# Patient Record
Sex: Female | Born: 1995 | Race: Black or African American | Hispanic: No | Marital: Single | Smoking: Never smoker
Health system: Southern US, Community
[De-identification: ages and names within clinical notes are randomized; demographics above are authoritative.]

## PROBLEM LIST (undated history)

## (undated) HISTORY — PX: KNEE SURGERY: SHX244

---
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
## 2013-04-07 ENCOUNTER — Emergency Department (HOSPITAL_COMMUNITY)
Admission: EM | Admit: 2013-04-07 | Discharge: 2013-04-07 | Disposition: A | Discharge status: Home / Self Care | Payer: No Typology Code available for payment source | Attending: Emergency Medicine | Admitting: Emergency Medicine

## 2013-04-07 ENCOUNTER — Emergency Department (HOSPITAL_COMMUNITY): Payer: No Typology Code available for payment source

## 2013-04-07 ENCOUNTER — Encounter (HOSPITAL_COMMUNITY): Payer: Self-pay | Admitting: Emergency Medicine

## 2013-04-07 ENCOUNTER — Other Ambulatory Visit: Payer: Self-pay

## 2013-04-07 DIAGNOSIS — Z3202 Encounter for pregnancy test, result negative: Secondary | ICD-10-CM | POA: Insufficient documentation

## 2013-04-07 DIAGNOSIS — R0789 Other chest pain: Secondary | ICD-10-CM

## 2013-04-07 DIAGNOSIS — Z8249 Family history of ischemic heart disease and other diseases of the circulatory system: Secondary | ICD-10-CM | POA: Insufficient documentation

## 2013-04-07 DIAGNOSIS — R071 Chest pain on breathing: Secondary | ICD-10-CM | POA: Insufficient documentation

## 2013-04-07 LAB — URINALYSIS, ROUTINE W REFLEX MICROSCOPIC
Bilirubin Urine: NEGATIVE
Glucose, UA: NEGATIVE mg/dL
Leukocytes, UA: NEGATIVE
Nitrite: NEGATIVE
Specific Gravity, Urine: 1.022 (ref 1.005–1.030)
Urobilinogen, UA: 0.2 mg/dL (ref 0.0–1.0)
pH: 6.5 (ref 5.0–8.0)

## 2013-04-07 LAB — BASIC METABOLIC PANEL
BUN: 12 mg/dL (ref 6–23)
Chloride: 104 mEq/L (ref 96–112)
GFR calc Af Amer: >90 mL/min (ref >90)
GFR calc non Af Amer: >90 mL/min (ref >90)
Potassium: 4.1 mEq/L (ref 3.5–5.1)
Sodium: 139 mEq/L (ref 135–145)

## 2013-04-07 LAB — POCT I-STAT TROPONIN I: Troponin i, poc: 0 ng/mL (ref 0.00–0.08)

## 2013-04-07 LAB — CBC
HCT: 39.8 % (ref 36.0–46.0)
MCH: 28.8 pg (ref 26.0–34.0)
Platelets: 266 10*3/uL (ref 150–400)
RDW: 13.7 % (ref 11.5–15.5)
WBC: 7.8 10*3/uL (ref 4.0–10.5)

## 2013-04-07 MED ORDER — IBUPROFEN 800 MG PO TABS: 800.0000 mg | ORAL_TABLET | Freq: Three times a day (TID) | ORAL | Status: DC

## 2013-04-07 NOTE — ED Provider Notes (Signed)
CSN: [id number]     Arrival date & time 04/07/13  4098 History   First MD Initiated Contact with Patient 04/07/13 0557     Chief Complaint  Patient presents with  . Chest Pain   (Consider location/radiation/quality/duration/timing/severity/associated sxs/prior Treatment) The history is provided by the patient.   Patient woke up this morning and noticed the cramping throbbing pain in her left lower chest. The pain is exacerbated by movement and walking and deep inspiration. The pain is constant. Denies fevers, cough, shortness of breath, leg swelling. Denies recent immobilization. No personal or family history of blood clots. She is on depo provera (progestin only). She is concerned because her mother did have a myocardial infarction at the age of 37 and her grandmother died of a myocardial infarction. Patient has also had intermittent pain in her left breast that comes and goes but has not her felt a mass or lump. Denies trauma.    History reviewed. No pertinent past medical history. Past Surgical History  Procedure Laterality Date  . Knee surgery     No family history on file. History  Substance Use Topics  . Smoking status: Never Smoker   . Smokeless tobacco: Not on file  . Alcohol Use: Not on file   OB History   Grav Para Term Preterm Abortions TAB SAB Ect Mult Living                 Review of Systems  Constitutional: Negative for fever, chills, activity change and appetite change.  Respiratory: Negative for cough, choking, chest tightness, shortness of breath, wheezing and stridor.   Cardiovascular: Positive for chest pain.  Gastrointestinal: Negative for nausea, vomiting, abdominal pain and diarrhea.  Genitourinary: Negative for dysuria, urgency, frequency, vaginal bleeding, vaginal discharge and menstrual problem.  Skin: Negative for rash and wound.  Allergic/Immunologic: Negative for immunocompromised state.    Allergies  Review of patient's allergies indicates not on  file.  Home Medications   Current Outpatient Rx  Name  Route  Sig  Dispense  Refill  . medroxyPROGESTERone (DEPO-PROVERA) 150 MG/ML injection   Intramuscular   Inject 150 mg into the muscle every 3 (three) months.          BP 138/95  Pulse 90  Temp(Src) 99.2 F (37.3 C) (Oral)  Resp 16  Ht 5\' 5"  (1.651 m)  Wt 145 lb (65.772 kg)  BMI 24.13 kg/m2  SpO2 100% Physical Exam  Nursing note and vitals reviewed. Constitutional: She appears well-developed and well-nourished. No distress.  HENT:  Head: Normocephalic and atraumatic.  Neck: Neck supple.  Cardiovascular: Normal rate, regular rhythm and intact distal pulses.   Pulmonary/Chest: Effort normal and breath sounds normal. No respiratory distress. She has no wheezes. She has no rales. She exhibits no tenderness.  No masses.    Abdominal: Soft. She exhibits no distension. There is no tenderness. There is no rebound and no guarding.  Musculoskeletal: She exhibits no edema.  Neurological: She is alert.  Skin: She is not diaphoretic.    ED Course  Procedures (including critical care time) Labs Review Labs Reviewed  CBC  BASIC METABOLIC PANEL  URINALYSIS, ROUTINE W REFLEX MICROSCOPIC  POCT PREGNANCY, URINE  POCT I-STAT TROPONIN I   Imaging Review Dg Chest 2 View  04/07/2013   CLINICAL DATA:  Left-sided chest pain.  EXAM: CHEST  2 VIEW  COMPARISON:  None.  FINDINGS: The lungs are well-aerated and clear. There is no evidence of focal opacification, pleural effusion or pneumothorax.  The heart is normal in size; the mediastinal contour is within normal limits. No acute osseous abnormalities are seen.  IMPRESSION: No acute cardiopulmonary process seen.   Electronically Signed   By: Roanna Raider M.D.   On: 04/07/2013 06:57    EKG Interpretation   None      PERC negative    Date: 04/07/2013  Rate: 81  Rhythm: normal sinus rhythm with PVC  QRS Axis: normal  Intervals: normal  ST/T Wave abnormalities: normal   Conduction Disutrbances: none  Narrative Interpretation:   Old EKG Reviewed: unavailable    MDM   1. Chest wall pain    Pt with left lower chest wall pain that she noticed this morning after waking up.  She is not SOB, has no cough, O2 is 100% and has no risk factors for PE.  I do not think this is a PE.  She is PERC negative. She has no respiratory symptoms, doubt pneumonia. CXR is clear.  EKG is normal. Labs ordered in triage are all normal.  D/C home with NSAIDs, PCP follow up.  Discussed results, findings, treatment, and follow up  with patient.  Pt given return precautions.  Pt verbalizes understanding and agrees with plan.       [place], PA-C 04/07/13 [phone]

## 2013-04-07 NOTE — ED Notes (Signed)
She was not triaged before brought back.  She was brought back and I was informed she needed to be triaged.

## 2013-04-07 NOTE — ED Notes (Signed)
Pt presents to the ED with complaints of "pinching" left sided CP and breast pain that woke her up from her sleep around 0430. Denies cough/SOB/N/V/D, lungs sound clear bilaterally. Pt also reports family history of early diagnosed breast cancer and is concerned about this and wanting help with self breast exams.

## 2013-04-16 NOTE — ED Provider Notes (Signed)
Medical screening examination/treatment/procedure(s) were performed by non-physician practitioner and as supervising physician I was immediately available for consultation/collaboration.   Kryslyn Helbig, MD 04/16/13 0502 

## 2013-07-20 ENCOUNTER — Emergency Department (HOSPITAL_COMMUNITY)
Admission: EM | Admit: 2013-07-20 | Discharge: 2013-07-20 | Disposition: A | Discharge status: Home / Self Care | Payer: BC Managed Care – PPO | Source: Home / Self Care | Attending: Family Medicine | Admitting: Family Medicine

## 2013-07-20 ENCOUNTER — Other Ambulatory Visit (HOSPITAL_COMMUNITY)
Admission: RE | Admit: 2013-07-20 | Discharge: 2013-07-20 | Disposition: A | Discharge status: Home / Self Care | Payer: BC Managed Care – PPO | Source: Ambulatory Visit | Attending: Family Medicine | Admitting: Family Medicine

## 2013-07-20 ENCOUNTER — Encounter (HOSPITAL_COMMUNITY): Payer: Self-pay | Admitting: Emergency Medicine

## 2013-07-20 DIAGNOSIS — N760 Acute vaginitis: Secondary | ICD-10-CM

## 2013-07-20 LAB — POCT URINALYSIS DIP (DEVICE)
BILIRUBIN URINE: NEGATIVE
Glucose, UA: NEGATIVE mg/dL
HGB URINE DIPSTICK: NEGATIVE
KETONES UR: NEGATIVE mg/dL
Leukocytes, UA: NEGATIVE
NITRITE: NEGATIVE
Protein, ur: NEGATIVE mg/dL
SPECIFIC GRAVITY, URINE: 1.025 (ref 1.005–1.030)
Urobilinogen, UA: 0.2 mg/dL (ref 0.0–1.0)
pH: 6.5 (ref 5.0–8.0)

## 2013-07-20 LAB — POCT PREGNANCY, URINE: Preg Test, Ur: NEGATIVE

## 2013-07-20 MED ORDER — METRONIDAZOLE 500 MG PO TABS: 500.0000 mg | ORAL_TABLET | Freq: Two times a day (BID) | ORAL | Status: DC

## 2013-07-20 NOTE — ED Notes (Signed)
Call back number for lab issues verified 

## 2013-07-20 NOTE — Discharge Instructions (Signed)
Bacterial Vaginosis Bacterial vaginosis is an infection of the vagina. It happens when too many of certain germs (bacteria) grow in the vagina. HOME CARE  Take your medicine as told by your doctor.  Finish your medicine even if you start to feel better.  Do not have sex until you finish your medicine and are better.  Tell your sex partner that you have an infection. They should see their doctor for treatment.  Practice safe sex. Use condoms. Have only one sex partner. GET HELP IF:  You are not getting better after 3 days of treatment.  You have more grey fluid (discharge) coming from your vagina than before.  You have more pain than before.  You have a fever. MAKE SURE YOU:   Understand these instructions.  Will watch your condition.  Will get help right away if you are not doing well or get worse. Document Released: 01/22/2008 Document Revised: 02/02/2013 Document Reviewed: 11/24/2012 Exit[employer] Patient Information 2014 [employer].  

## 2013-07-20 NOTE — ED Notes (Signed)
Not sure if this is BV or yeast

## 2013-07-20 NOTE — ED Provider Notes (Signed)
Medical screening examination/treatment/procedure(s) were performed by a resident physician or non-physician practitioner and as the supervising physician I was immediately available for consultation/collaboration.  Trinna Kunst, MD    Abem Shaddix S Yoel Kaufhold, MD 07/20/13 2151 

## 2013-07-20 NOTE — ED Provider Notes (Signed)
CSN: [id number]     Arrival date & time 07/20/13  1614 History   First MD Initiated Contact with Patient 07/20/13 1730     Chief Complaint  Patient presents with  . Vaginal Discharge   (Consider location/radiation/quality/duration/timing/severity/associated sxs/prior Treatment) HPI Patient is a 18 yo F presenting to urgent care for vaginal discharge.  Patient was treated three times last month for yeast infections. (Fluconazole x2 and cream x1). She states the discharge went away but only for a day or so. She states she has had discharge for the last 2-3 weeks. Discharge is white/clear and is malodorous. She endorses some itching, but was diagnosed with herpes a few weeks ago. She wears a new panty liner every day. She was treated for GC/Ch and trich at the health dept last month (all negative) and denies any new partners. She is not currently on any birth control and has not had a period since she stopped depo on Feb 28. Denies abd pain, fevers, some post-coital bleeding.   History reviewed. No pertinent past medical history. Past Surgical History  Procedure Laterality Date  . Knee surgery     History reviewed. No pertinent family history. History  Substance Use Topics  . Smoking status: Never Smoker   . Smokeless tobacco: Not on file  . Alcohol Use: Not on file   OB History   Grav Para Term Preterm Abortions TAB SAB Ect Mult Living                 Review of Systems  Constitutional: Negative for fever and chills.  HENT: Negative for congestion.   Eyes: Negative for visual disturbance.  Respiratory: Negative for cough and shortness of breath.   Cardiovascular: Negative for chest pain and leg swelling.  Gastrointestinal: Negative for abdominal pain.  Genitourinary: Positive for vaginal bleeding and vaginal discharge. Negative for dysuria, vaginal pain and pelvic pain.  Musculoskeletal: Negative for arthralgias and myalgias.  Skin: Negative for rash.  Neurological: Negative for  headaches.    Allergies  Review of patient's allergies indicates no known allergies.  Home Medications   Current Outpatient Rx  Name  Route  Sig  Dispense  Refill  . ibuprofen (ADVIL,MOTRIN) 800 MG tablet   Oral   Take 1 tablet (800 mg total) by mouth 3 (three) times daily.   15 tablet   0   . medroxyPROGESTERone (DEPO-PROVERA) 150 MG/ML injection   Intramuscular   Inject 150 mg into the muscle every 3 (three) months.         . metroNIDAZOLE (FLAGYL) 500 MG tablet   Oral   Take 1 tablet (500 mg total) by mouth 2 (two) times daily.   14 tablet   0    BP 134/92  Pulse 77  Temp(Src) 98.4 F (36.9 C) (Oral)  Resp 16  SpO2 100% Physical Exam  Constitutional: She is oriented to person, place, and time. She appears well-developed and well-nourished. No distress.  HENT:  Head: Normocephalic and atraumatic.  Neck: Normal range of motion.  Cardiovascular: Normal rate, regular rhythm and normal heart sounds.   Pulmonary/Chest: Effort normal and breath sounds normal. She has no wheezes.  Abdominal: Soft. She exhibits no distension. There is no tenderness.  Genitourinary: Uterus normal. Vaginal discharge (Moderate amonth of thin, white discharge. No odor.) found.  Musculoskeletal: Normal range of motion. She exhibits no edema and no tenderness.  Neurological: She is alert and oriented to person, place, and time.  Skin: Skin is warm and dry.  Psychiatric: She has a normal mood and affect.    ED Course  Procedures (including critical care time) Labs Review Labs Reviewed  POCT URINALYSIS DIP (DEVICE)  POCT PREGNANCY, URINE  CERVICOVAGINAL ANCILLARY ONLY   Imaging Review No results found.  MDM   1. Vaginitis    - Urine preg negative - Recently tested for STDs and declined retesting at this time - Affirm sent - Recently treated x3 for yeast without resolution of symptoms. Will treat with Flagyl for BV. Advised to avoid any irritating lotions, soaps, pads, body  washes. - F/u with PCP if fails to improve.    Hilarie FredricksonAmber M Jessalynn Mccowan, MD 07/20/13 [phone]

## 2013-07-20 NOTE — ED Notes (Signed)
affirm test only, no gc/chlamydia

## 2013-07-21 LAB — CERVICOVAGINAL ANCILLARY ONLY
WET PREP (BD AFFIRM): NEGATIVE
WET PREP (BD AFFIRM): POSITIVE — AB
Wet Prep (BD Affirm): NEGATIVE

## 2013-07-23 NOTE — ED Notes (Signed)
Affirm: Candida and Trich neg., Gardnerella pos. Pt. adequately treated with Flagyl. Vassie MoselleYork, Bobby Ragan M 07/23/2013

## 2017-04-28 NOTE — L&D Delivery Note (Signed)
Delivery Note Pushed for 2 hours. At 10:57 AM a viable and healthy female was delivered via Vaginal, Spontaneous (Presentation: LOA).  APGAR: 9, 9; weight 8 lb 6.9 oz (3825 g).   Placenta status: Spontaneous, intact.  Cord:3VC  with the following complications: None. Trailing membranes removed w/ ring forceps  Cord pH: NA  Anesthesia: Epidural Episiotomy: None Lacerations: 1st degree;Perineal;Labial, hemostatic Suture Repair: NA Est. Blood Loss (mL): 150  Mom to postpartum.  Baby to Couplet care / Skin to Skin. Please schedule this patient for Postpartum visit in: 4 weeks with the following provider: Any provider For C/S patients schedule nurse incision check in weeks 2 weeks: no Low risk pregnancy complicated by: Nothing Delivery mode:  SVD Anticipated Birth Control:  Nexplanon vs Depo PP Procedures needed: None  Schedule Integrated BH visit: no  [place] 12/28/2017, 10:29 PM

## 2017-05-20 LAB — OB RESULTS CONSOLE RUBELLA ANTIBODY, IGM: RUBELLA: IMMUNE

## 2017-07-06 ENCOUNTER — Encounter: Payer: Self-pay | Admitting: Obstetrics and Gynecology

## 2017-07-06 ENCOUNTER — Ambulatory Visit (INDEPENDENT_AMBULATORY_CARE_PROVIDER_SITE_OTHER): Payer: Managed Care, Other (non HMO) | Admitting: Obstetrics and Gynecology

## 2017-07-06 VITALS — BP 126/84 | HR 92 | Wt 168.5 lb

## 2017-07-06 DIAGNOSIS — Z23 Encounter for immunization: Secondary | ICD-10-CM

## 2017-07-06 DIAGNOSIS — Z3402 Encounter for supervision of normal first pregnancy, second trimester: Secondary | ICD-10-CM

## 2017-07-06 DIAGNOSIS — Z3481 Encounter for supervision of other normal pregnancy, first trimester: Secondary | ICD-10-CM | POA: Diagnosis not present

## 2017-07-06 DIAGNOSIS — Z8659 Personal history of other mental and behavioral disorders: Secondary | ICD-10-CM

## 2017-07-06 DIAGNOSIS — Z3400 Encounter for supervision of normal first pregnancy, unspecified trimester: Secondary | ICD-10-CM | POA: Insufficient documentation

## 2017-07-06 MED ORDER — VITAFOL ULTRA 29-0.6-0.4-200 MG PO CAPS
1.0000 | ORAL_CAPSULE | Freq: Every day | ORAL | 12 refills | Status: DC
Start: 2017-07-06 — End: 2017-12-30

## 2017-07-06 NOTE — Progress Notes (Signed)
   PRENATAL VISIT NOTE  Subjective:  Nichole Taylor is a 22 y.o. G1P0 at 117w1d being seen today for ongoing prenatal care. Patient transferred care from [facility]. She is currently monitored for the following issues for this low-risk pregnancy and has Supervision of normal first pregnancy, antepartum and History of depression on their problem list.  Patient reports no complaints.   . Vag. Bleeding: None.  Movement: Absent. Denies leaking of fluid.   The following portions of the patient's history were reviewed and updated as appropriate: allergies, current medications, past family history, past medical history, past social history, past surgical history and problem list. Problem list updated.  Objective:   Vitals:   07/06/17 1317  BP: 126/84  Pulse: 92  Weight: 168 lb 8 oz (76.4 kg)    Fetal Status: Fetal Heart Rate (bpm): 160   Movement: Absent     General:  Alert, oriented and cooperative. Patient is in no acute distress.  Skin: Skin is warm and dry. No rash noted.   Cardiovascular: Normal heart rate noted  Respiratory: Normal respiratory effort, no problems with respiration noted  Abdomen: Soft, gravid, appropriate for gestational age.  Pain/Pressure: Absent     Pelvic: Cervical exam deferred        Extremities: Normal range of motion.  Edema: None  Mental Status:  Normal mood and affect. Normal behavior. Normal judgment and thought content.   Assessment and Plan:  Pregnancy: G1P0 at 6417w1d  1. Supervision of normal first pregnancy, antepartum Patient is doing well Agreed to flu vaccine today Anatomy ultrasound ordered Carrier screening and NIPS today - US MFM OB COMP + 14 WK; Future - Flu Vaccine QUAD 36+ mos IM (Fluarix, Quad PF) - Flu Vaccine QUAD 36+ mos IM - Genetic Screening - Cystic Fibrosis Mutation 97 - SMN1 COPY NUMBER ANALYSIS (SMA Carrier Screen)  2. History of depression Patient reports feeling well No medication needed  Preterm labor  symptoms and general obstetric precautions including but not limited to vaginal bleeding, contractions, leaking of fluid and fetal movement were reviewed in detail with the patient. Please refer to After Visit Summary for other counseling recommendations.  Return in about 4 weeks (around 08/03/2017) for ROB.   Catalina AntiguaPeggy Kelyn Koskela, MD

## 2017-07-08 LAB — URINE CULTURE, OB REFLEX

## 2017-07-08 LAB — CULTURE, OB URINE

## 2017-07-14 LAB — SMN1 COPY NUMBER ANALYSIS (SMA CARRIER SCREENING)

## 2017-07-15 LAB — CYSTIC FIBROSIS MUTATION 97: Interpretation: NOT DETECTED

## 2017-07-30 ENCOUNTER — Encounter (HOSPITAL_COMMUNITY): Payer: Self-pay | Admitting: *Deleted

## 2017-07-30 ENCOUNTER — Encounter (HOSPITAL_COMMUNITY): Payer: Self-pay | Admitting: Obstetrics and Gynecology

## 2017-07-31 ENCOUNTER — Ambulatory Visit (HOSPITAL_COMMUNITY): Payer: Managed Care, Other (non HMO)

## 2017-08-03 ENCOUNTER — Encounter: Payer: Self-pay | Admitting: Certified Nurse Midwife

## 2017-08-03 ENCOUNTER — Ambulatory Visit (INDEPENDENT_AMBULATORY_CARE_PROVIDER_SITE_OTHER): Payer: Managed Care, Other (non HMO) | Admitting: Certified Nurse Midwife

## 2017-08-03 VITALS — BP 121/79 | HR 94 | Wt 175.0 lb

## 2017-08-03 DIAGNOSIS — Z3400 Encounter for supervision of normal first pregnancy, unspecified trimester: Secondary | ICD-10-CM

## 2017-08-03 NOTE — Progress Notes (Signed)
   PRENATAL VISIT NOTE  Subjective:  Nichole Taylor is a 22 y.o. G1P0 at 553w1d being seen today for ongoing prenatal care.  She is currently monitored for the following issues for this low-risk pregnancy and has Supervision of normal first pregnancy, antepartum and History of depression on their problem list.  Patient reports no complaints.  Contractions: Not present. Vag. Bleeding: None.  Movement: Absent. Denies leaking of fluid.   The following portions of the patient's history were reviewed and updated as appropriate: allergies, current medications, past family history, past medical history, past social history, past surgical history and problem list. Problem list updated.  Objective:   Vitals:   08/03/17 0915  BP: 121/79  Pulse: 94  Weight: 175 lb (79.4 kg)    Fetal Status: Fetal Heart Rate (bpm): 155; doppler   Movement: Absent     General:  Alert, oriented and cooperative. Patient is in no acute distress.  Skin: Skin is warm and dry. No rash noted.   Cardiovascular: Normal heart rate noted  Respiratory: Normal respiratory effort, no problems with respiration noted  Abdomen: Soft, gravid, appropriate for gestational age.  Pain/Pressure: Absent     Pelvic: Cervical exam deferred        Extremities: Normal range of motion.  Edema: None  Mental Status: Normal mood and affect. Normal behavior. Normal judgment and thought content.   Assessment and Plan:  Pregnancy: G1P0 at 2753w1d  1. Supervision of normal first pregnancy, antepartum     Doing well - AFP, Serum, Open Spina Bifida  Preterm labor symptoms and general obstetric precautions including but not limited to vaginal bleeding, contractions, leaking of fluid and fetal movement were reviewed in detail with the patient. Please refer to After Visit Summary for other counseling recommendations.  Return in about 1 month (around 08/31/2017) for ROB.  Future Appointments  Date Time Provider Department Center  08/04/2017  8:15 AM  [facility] US 4 [facility]  08/31/2017  9:00 AM Leida Luton, Rodell Pernaachelle A, CNM [facility] None    Roe Coombsachelle A Amariah Kierstead, CNM

## 2017-08-04 ENCOUNTER — Other Ambulatory Visit: Payer: Self-pay | Admitting: Obstetrics and Gynecology

## 2017-08-04 ENCOUNTER — Ambulatory Visit (HOSPITAL_COMMUNITY)
Admission: RE | Admit: 2017-08-04 | Discharge: 2017-08-04 | Disposition: A | Discharge status: Home / Self Care | Payer: Managed Care, Other (non HMO) | Source: Ambulatory Visit | Attending: Obstetrics and Gynecology | Admitting: Obstetrics and Gynecology

## 2017-08-04 DIAGNOSIS — Z3400 Encounter for supervision of normal first pregnancy, unspecified trimester: Secondary | ICD-10-CM

## 2017-08-04 DIAGNOSIS — Z3A19 19 weeks gestation of pregnancy: Secondary | ICD-10-CM

## 2017-08-04 DIAGNOSIS — Z363 Encounter for antenatal screening for malformations: Secondary | ICD-10-CM | POA: Diagnosis present

## 2017-08-06 ENCOUNTER — Other Ambulatory Visit: Payer: Self-pay | Admitting: Certified Nurse Midwife

## 2017-08-06 DIAGNOSIS — Z3400 Encounter for supervision of normal first pregnancy, unspecified trimester: Secondary | ICD-10-CM

## 2017-08-06 LAB — AFP, SERUM, OPEN SPINA BIFIDA
AFP MoM: 0.67
AFP Value: 33.3 ng/mL
GEST. AGE ON COLLECTION DATE: 19.1 wk
MATERNAL AGE AT EDD: 23.8 a
OSBR Risk 1 IN: 10000
TEST RESULTS AFP: NEGATIVE
Weight: 168 [lb_av]

## 2017-08-31 ENCOUNTER — Other Ambulatory Visit (HOSPITAL_COMMUNITY)
Admission: RE | Admit: 2017-08-31 | Discharge: 2017-08-31 | Disposition: A | Discharge status: Home / Self Care | Payer: Managed Care, Other (non HMO) | Source: Ambulatory Visit | Attending: Certified Nurse Midwife | Admitting: Certified Nurse Midwife

## 2017-08-31 ENCOUNTER — Ambulatory Visit (INDEPENDENT_AMBULATORY_CARE_PROVIDER_SITE_OTHER): Payer: Managed Care, Other (non HMO) | Admitting: Certified Nurse Midwife

## 2017-08-31 ENCOUNTER — Encounter: Payer: Self-pay | Admitting: Certified Nurse Midwife

## 2017-08-31 VITALS — BP 129/83 | HR 93 | Wt 180.4 lb

## 2017-08-31 DIAGNOSIS — Z3400 Encounter for supervision of normal first pregnancy, unspecified trimester: Secondary | ICD-10-CM

## 2017-08-31 DIAGNOSIS — N898 Other specified noninflammatory disorders of vagina: Secondary | ICD-10-CM | POA: Insufficient documentation

## 2017-08-31 NOTE — Progress Notes (Signed)
   PRENATAL VISIT NOTE  Subjective:  Nichole Taylor is a 22 y.o. G1P0 at [redacted]w[redacted]d being seen today for ongoing prenatal care.  She is currently monitored for the following issues for this low-risk pregnancy and has Supervision of normal first pregnancy, antepartum and History of depression on their problem list.  Patient reports no bleeding, no contractions, no cramping, no leaking and vaginal irritation.  Contractions: Not present. Vag. Bleeding: None.  Movement: Present. Denies leaking of fluid.   The following portions of the patient's history were reviewed and updated as appropriate: allergies, current medications, past family history, past medical history, past social history, past surgical history and problem list. Problem list updated.  Objective:   Vitals:   08/31/17 0907  BP: 129/83  Pulse: 93  Weight: 180 lb 6.4 oz (81.8 kg)    Fetal Status:     Movement: Present     General:  Alert, oriented and cooperative. Patient is in no acute distress.  Skin: Skin is warm and dry. No rash noted.   Cardiovascular: Normal heart rate noted  Respiratory: Normal respiratory effort, no problems with respiration noted  Abdomen: Soft, gravid, appropriate for gestational age.  Pain/Pressure: Absent     Pelvic: Cervical exam deferred        Extremities: Normal range of motion.  Edema: None  Mental Status: Normal mood and affect. Normal behavior. Normal judgment and thought content.   Assessment and Plan:  Pregnancy: G1P0 at [redacted]w[redacted]d  1. Supervision of normal first pregnancy, antepartum     Doing well.   - [place] MFM OB FOLLOW UP; Future  2. Vaginal discharge       - Cervicovaginal ancillary only  Preterm labor symptoms and general obstetric precautions including but not limited to vaginal bleeding, contractions, leaking of fluid and fetal movement were reviewed in detail with the patient. Please refer to After Visit Summary for other counseling recommendations.  Return in about 1 month (around  09/28/2017) for ROB, 2 hr OGTT.  No future appointments.  Roe Coombs, CNM

## 2017-08-31 NOTE — Patient Instructions (Signed)
AREA PEDIATRIC/FAMILY PRACTICE PHYSICIANS  [facility] [address], [address] [place], [place]  [address] Phone - [phone]   Fax - [phone]  [facility] [address] [place], [place] [address] Phone - [phone]   Fax - [phone]  [facility] [address] [place], [place]  [address] Phone - [phone]   Fax - [phone]  [facility] [address] [place], [place]  [address] Phone - [phone]   Fax - [phone]  [facility] [address] [place], [place]  [address] Phone - [phone]   Fax - [phone]  [facility] [address], [address] [place], [place]  [address] Phone - [phone]   Fax - [phone]  [facility] [address] [place], [place]  [address] Phone - [phone]   Fax - [phone]  [facility] [address], [address] [place], [place]  [address] Phone - [phone]   Fax - [phone]  [facility] [address] [place], [place]  [address] Phone - [phone]   Fax - [phone] [facility] [address] [place], [place]  [address] Phone - [phone]   Fax - [phone]  [facility] [address] [place], [place]  [address] Phone - [phone]   Fax - [phone]  [facility] [address] [place], [place]  [address] Phone - [phone]   Fax - [phone]  [facility] [address], [address] [place], [place]  [address] Phone - [phone]   Fax - [phone]  [facility] [address] [place], [place]  [address] Phone - [phone]  [facility] [address] [place], [place]  [address] Phone - [phone]   Fax - [phone]  [facility] [address] [place], [place]  [address] Phone - [phone]   Fax - [phone]  [facility] [address] [place], [place]  [address] Phone - [phone]   Fax - [phone]  [facility] [address] [place], [place]  [address] Phone - [phone]   Fax - [phone]   [facility] [address] [place], [place]  [address] Phone - [phone]   Fax - [phone]  [facility] [address] [place], [place]  [address] Phone - [phone]   Fax - [phone]  [facility] [address] [place], [place]  [address] Phone - [phone]   Fax - [phone]  [facility] [address] [place], [place]  [address] Phone - [phone]   Fax - [phone]  [facility] [address] , [place]  [address] Phone - [phone]   Fax - [phone] [facility] [address] [place], [place]  [address] Phone - [phone]   Fax - [phone]  [facility] [address] [place], [place]  [address] Phone - [phone]   Fax - [phone]  [facility] [address] [place], [place]  [address] Phone - [phone]   Fax - [phone]  [facility] Charlene Flemming MD [address] [place] [address] Phone [phone]  Fax [phone]   

## 2017-09-01 LAB — CERVICOVAGINAL ANCILLARY ONLY
Bacterial vaginitis: NEGATIVE
Candida vaginitis: NEGATIVE
Chlamydia: NEGATIVE
NEISSERIA GONORRHEA: NEGATIVE
TRICH (WINDOWPATH): NEGATIVE

## 2017-09-14 ENCOUNTER — Ambulatory Visit (HOSPITAL_COMMUNITY)
Admission: RE | Admit: 2017-09-14 | Discharge: 2017-09-14 | Disposition: A | Discharge status: Home / Self Care | Payer: Managed Care, Other (non HMO) | Source: Ambulatory Visit | Attending: Certified Nurse Midwife | Admitting: Certified Nurse Midwife

## 2017-09-14 ENCOUNTER — Other Ambulatory Visit: Payer: Self-pay | Admitting: Certified Nurse Midwife

## 2017-09-14 DIAGNOSIS — IMO0002 Reserved for concepts with insufficient information to code with codable children: Secondary | ICD-10-CM

## 2017-09-14 DIAGNOSIS — Z0489 Encounter for examination and observation for other specified reasons: Secondary | ICD-10-CM

## 2017-09-14 DIAGNOSIS — Z3400 Encounter for supervision of normal first pregnancy, unspecified trimester: Secondary | ICD-10-CM

## 2017-09-14 DIAGNOSIS — Z362 Encounter for other antenatal screening follow-up: Secondary | ICD-10-CM | POA: Insufficient documentation

## 2017-09-14 DIAGNOSIS — Z3A25 25 weeks gestation of pregnancy: Secondary | ICD-10-CM | POA: Insufficient documentation

## 2017-09-14 DIAGNOSIS — Z7681 Expectant parent(s) prebirth pediatrician visit: Secondary | ICD-10-CM | POA: Insufficient documentation

## 2017-09-28 ENCOUNTER — Other Ambulatory Visit: Payer: Managed Care, Other (non HMO)

## 2017-09-28 ENCOUNTER — Ambulatory Visit (INDEPENDENT_AMBULATORY_CARE_PROVIDER_SITE_OTHER): Payer: Managed Care, Other (non HMO) | Admitting: Certified Nurse Midwife

## 2017-09-28 ENCOUNTER — Other Ambulatory Visit: Payer: Self-pay

## 2017-09-28 VITALS — BP 123/81 | HR 96 | Wt 186.5 lb

## 2017-09-28 DIAGNOSIS — Z3402 Encounter for supervision of normal first pregnancy, second trimester: Secondary | ICD-10-CM

## 2017-09-28 DIAGNOSIS — Z23 Encounter for immunization: Secondary | ICD-10-CM | POA: Diagnosis not present

## 2017-09-28 DIAGNOSIS — Z3400 Encounter for supervision of normal first pregnancy, unspecified trimester: Secondary | ICD-10-CM

## 2017-09-28 NOTE — Patient Instructions (Signed)
AREA PEDIATRIC/FAMILY PRACTICE PHYSICIANS  [facility] [address], [address] [place], [place]  [address] Phone - [phone]   Fax - [phone]  [facility] [address] [place], [place] [address] Phone - [phone]   Fax - [phone]  JACK AMOS [address] [place], [place]  [address] Phone - [phone]   Fax - [phone]  [facility] [address] [place], [place]  [address] Phone - [phone]   Fax - [phone]  [facility] [address] [place], [place]  [address] Phone - [phone]   Fax - [phone]  [facility] [address] [place], [place]  [address] Phone - [phone]   Fax - [phone]  [facility] [address] [place], [place]  [address] Phone - [phone]   Fax - [phone]  [facility] [address] [place], [place]  [address] Phone - [phone]   Fax - [phone]  [facility] [address] [place], [place]  [address] Phone - [phone]   Fax - [phone] [facility] [address] [place], [place]  [address] Phone - [phone]   Fax - [phone]  [facility] [address] [place], [place]  [address] Phone - [phone]   Fax - [phone]  [facility] [address] [place], [place]  [address] Phone - [phone]   Fax - [phone]  [facility] [address] [place], [place]  [address] Phone - [phone]   Fax - [phone]  SHILPA GOSRANI [address] [place], [place]  [address] Phone - [phone]  [facility] [address] [place], [place]  [address] Phone - [phone]   Fax - [phone]  [facility] [address] [place], [place]  [address] Phone - [phone]   Fax - [phone]  [facility] [address] [place], [place]  [address] Phone - [phone]   Fax - [phone]  [facility] [address] [place], [place]  [address] Phone - [phone]   Fax - [phone]   [facility] [address] [place], [place]  [address] Phone - [phone]   Fax - [phone]  [facility] [address] [place], [place]  [address] Phone - [phone]   Fax - [phone]  DAVID RUBIN [address] [place], [place]  [address] Phone - [phone]   Fax - [phone]  [facility] [address] [place], [place]  [address] Phone - [phone]   Fax - [phone]  [facility] [address] [place], [place]  [address] Phone - [phone]   Fax - [phone] [facility] [address] [place], [place]  [address] Phone - [phone]   Fax - [phone]  [facility] [address] [place], [place]  [address] Phone - [phone]   Fax - [phone]  [facility] [address] [place], [place]  [address] Phone - [phone]   Fax - [phone]  [facility] Charlene Flemming MD [address] [place] [address] Phone [phone]  Fax [phone]   

## 2017-09-28 NOTE — Progress Notes (Signed)
ROB/GTT.  TDAP given in left deltoid, tolerated well. 

## 2017-09-28 NOTE — Progress Notes (Signed)
   PRENATAL VISIT NOTE  Subjective:  Nichole Taylor is a 22 y.o. G1P0 at 4510w1d being seen today for ongoing prenatal care.  She is currently monitored for the following issues for this low-risk pregnancy and has Supervision of normal first pregnancy, antepartum and History of depression on their problem list.  Patient reports no complaints.  Contractions: Not present. Vag. Bleeding: None.  Movement: Present. Denies leaking of fluid.   The following portions of the patient's history were reviewed and updated as appropriate: allergies, current medications, past family history, past medical history, past social history, past surgical history and problem list. Problem list updated.  Objective:   Vitals:   09/28/17 0839  BP: 123/81  Pulse: 96  Weight: 186 lb 8 oz (84.6 kg)    Fetal Status: Fetal Heart Rate (bpm): 151; doppler Fundal Height: 27 cm Movement: Present     General:  Alert, oriented and cooperative. Patient is in no acute distress.  Skin: Skin is warm and dry. No rash noted.   Cardiovascular: Normal heart rate noted  Respiratory: Normal respiratory effort, no problems with respiration noted  Abdomen: Soft, gravid, appropriate for gestational age.  Pain/Pressure: Present     Pelvic: Cervical exam deferred        Extremities: Normal range of motion.  Edema: Trace  Mental Status: Normal mood and affect. Normal behavior. Normal judgment and thought content.   Assessment and Plan:  Pregnancy: G1P0 at 1710w1d  1. Encounter for supervision of normal first pregnancy in second trimester     Doing well.  - Glucose Tolerance, 2 Hours w/1 Hour - CBC - HIV antibody (with reflex) - RPR - Tdap vaccine greater than or equal to 7yo IM  2. Supervision of normal first pregnancy, antepartum       Preterm labor symptoms and general obstetric precautions including but not limited to vaginal bleeding, contractions, leaking of fluid and fetal movement were reviewed in detail with the  patient. Please refer to After Visit Summary for other counseling recommendations.  Return in about 2 weeks (around 10/12/2017) for ROB.  No future appointments.  Roe Coombsachelle A Larry Knipp, CNM

## 2017-09-29 ENCOUNTER — Other Ambulatory Visit: Payer: Self-pay | Admitting: Certified Nurse Midwife

## 2017-09-29 DIAGNOSIS — Z3400 Encounter for supervision of normal first pregnancy, unspecified trimester: Secondary | ICD-10-CM

## 2017-09-29 LAB — CBC
Hematocrit: 34.3 % (ref 34.0–46.6)
Hemoglobin: 11.2 g/dL (ref 11.1–15.9)
MCH: 28.7 pg (ref 26.6–33.0)
MCHC: 32.7 g/dL (ref 31.5–35.7)
MCV: 88 fL (ref 79–97)
Platelets: 234 10*3/uL (ref 150–450)
RBC: 3.9 x10E6/uL (ref 3.77–5.28)
RDW: 14.1 % (ref 12.3–15.4)
WBC: 13.1 10*3/uL — AB (ref 3.4–10.8)

## 2017-09-29 LAB — GLUCOSE TOLERANCE, 2 HOURS W/ 1HR
GLUCOSE, 2 HOUR: 103 mg/dL (ref 65–152)
Glucose, 1 hour: 103 mg/dL (ref 65–179)
Glucose, Fasting: 86 mg/dL (ref 65–91)

## 2017-09-29 LAB — HIV ANTIBODY (ROUTINE TESTING W REFLEX): HIV Screen 4th Generation wRfx: NONREACTIVE

## 2017-09-29 LAB — RPR: RPR Ser Ql: NONREACTIVE

## 2017-10-15 ENCOUNTER — Ambulatory Visit (INDEPENDENT_AMBULATORY_CARE_PROVIDER_SITE_OTHER): Payer: Managed Care, Other (non HMO) | Admitting: Certified Nurse Midwife

## 2017-10-15 VITALS — BP 120/74 | HR 97 | Wt 192.0 lb

## 2017-10-15 DIAGNOSIS — Z3403 Encounter for supervision of normal first pregnancy, third trimester: Secondary | ICD-10-CM

## 2017-10-15 DIAGNOSIS — Z3400 Encounter for supervision of normal first pregnancy, unspecified trimester: Secondary | ICD-10-CM

## 2017-10-15 NOTE — Progress Notes (Signed)
   PRENATAL VISIT NOTE  Subjective:  Bonnielee Haffyeshia Lao is a 22 y.o. G1P0 at 333w4d being seen today for ongoing prenatal care.  She is currently monitored for the following issues for this low-risk pregnancy and has Supervision of normal first pregnancy, antepartum and History of depression on their problem list.  Patient reports no complaints.  Contractions: Irritability. Vag. Bleeding: None.  Movement: Present. Denies leaking of fluid.   The following portions of the patient's history were reviewed and updated as appropriate: allergies, current medications, past family history, past medical history, past social history, past surgical history and problem list. Problem list updated.  Objective:   Vitals:   10/15/17 0816  BP: 120/74  Pulse: 97  Weight: 192 lb (87.1 kg)    Fetal Status: Fetal Heart Rate (bpm): 152; doppler Fundal Height: 29 cm Movement: Present     General:  Alert, oriented and cooperative. Patient is in no acute distress.  Skin: Skin is warm and dry. No rash noted.   Cardiovascular: Normal heart rate noted  Respiratory: Normal respiratory effort, no problems with respiration noted  Abdomen: Soft, gravid, appropriate for gestational age.  Pain/Pressure: Present     Pelvic: Cervical exam deferred        Extremities: Normal range of motion.     Mental Status: Normal mood and affect. Normal behavior. Normal judgment and thought content.   Assessment and Plan:  Pregnancy: G1P0 at 5633w4d  1. Supervision of normal first pregnancy, antepartum     Doing well.    Preterm labor symptoms and general obstetric precautions including but not limited to vaginal bleeding, contractions, leaking of fluid and fetal movement were reviewed in detail with the patient. Please refer to After Visit Summary for other counseling recommendations.  Return in about 2 weeks (around 10/29/2017) for ROB.  No future appointments.  Roe Coombsachelle A Marlyne Totaro, CNM

## 2017-10-15 NOTE — Patient Instructions (Addendum)
Third Trimester of Pregnancy The third trimester is from week 29 through week 42, months 7 through 9. This trimester is when your unborn baby (fetus) is growing very fast. At the end of the ninth month, the unborn baby is about 20 inches in length. It weighs about 6-10 pounds. Follow these instructions at home:  Avoid all smoking, herbs, and alcohol. Avoid drugs not approved by your doctor.  Do not use any tobacco products, including cigarettes, chewing tobacco, and electronic cigarettes. If you need help quitting, ask your doctor. You may get counseling or other support to help you quit.  Only take medicine as told by your doctor. Some medicines are safe and some are not during pregnancy.  Exercise only as told by your doctor. Stop exercising if you start having cramps.  Eat regular, healthy meals.  Wear a good support bra if your breasts are tender.  Do not use hot tubs, steam rooms, or saunas.  Wear your seat belt when driving.  Avoid raw meat, uncooked cheese, and liter boxes and soil used by cats.  Take your prenatal vitamins.  Take 1500-2000 milligrams of calcium daily starting at the 20th week of pregnancy until you deliver your baby.  Try taking medicine that helps you poop (stool softener) as needed, and if your doctor approves. Eat more fiber by eating fresh fruit, vegetables, and whole grains. Drink enough fluids to keep your pee (urine) clear or pale yellow.  Take warm water baths (sitz baths) to soothe pain or discomfort caused by hemorrhoids. Use hemorrhoid cream if your doctor approves.  If you have puffy, bulging veins (varicose veins), wear support hose. Raise (elevate) your feet for 15 minutes, 3-4 times a day. Limit salt in your diet.  Avoid heavy lifting, wear low heels, and sit up straight.  Rest with your legs raised if you have leg cramps or low back pain.  Visit your dentist if you have not gone during your pregnancy. Use a soft toothbrush to brush your  teeth. Be gentle when you floss.  You can have sex (intercourse) unless your doctor tells you not to.  Do not travel far distances unless you must. Only do so with your doctor's approval.  Take prenatal classes.  Practice driving to the hospital.  Pack your hospital bag.  Prepare the baby's room.  Go to your doctor visits. Get help if:  You are not sure if you are in labor or if your water has broken.  You are dizzy.  You have mild cramps or pressure in your lower belly (abdominal).  You have a nagging pain in your belly area.  You continue to feel sick to your stomach (nauseous), throw up (vomit), or have watery poop (diarrhea).  You have bad smelling fluid coming from your vagina.  You have pain with peeing (urination). Get help right away if:  You have a fever.  You are leaking fluid from your vagina.  You are spotting or bleeding from your vagina.  You have severe belly cramping or pain.  You lose or gain weight rapidly.  You have trouble catching your breath and have chest pain.  You notice sudden or extreme puffiness (swelling) of your face, hands, ankles, feet, or legs.  You have not felt the baby move in over an hour.  You have severe headaches that do not go away with medicine.  You have vision changes. This information is not intended to replace advice given to you by your health care provider. Make   sure you discuss any questions you have with your health care provider. Document Released: 07/09/2009 Document Revised: 09/20/2015 Document Reviewed: 06/15/2012 Elsevier Interactive Patient Education  2017 [employer].  Before Baby Comes Home Before your baby arrives it is important to:  Have all of the supplies that you will need to care for your baby.  Know where to go if there is an emergency.  Discuss the baby's arrival with other family members.  What supplies will I need?  It is recommended that you have the following supplies: Large  Items  Crib.  Crib mattress.  Rear-facing infant car seat. If possible, have a trained professional check to make sure that it is installed correctly.  Feeding  6-8 bottles that are 4-5 oz in size.  6-8 nipples.  Bottle brush.  Sterilizer, or a large pan or kettle with a lid.  A way to boil and cool water.  If you will be breastfeeding: ? Breast pump. ? Nipple cream. ? Nursing bra. ? Breast pads. ? Breast shields.  If you will be formula feeding: ? Formula. ? Measuring cups. ? Measuring spoons.  Bathing  Mild baby soap and baby shampoo.  Petroleum jelly.  Soft cloth towel and washcloth.  Hooded towel.  Cotton balls.  Bath basin.  Other Supplies  Rectal thermometer.  Bulb syringe.  Baby wipes or washcloths for diaper changes.  Diaper bag.  Changing pad.  Clothing, including one-piece outfits and pajamas.  Baby nail clippers.  Receiving blankets.  Mattress pad and sheets for the crib.  Night-light for the baby's room.  Baby monitor.  2 or 3 pacifiers.  Either 24-36 cloth diapers and waterproof diaper covers or a box of disposable diapers. You may need to use as many as 10-12 diapers per day.  How do I prepare for an emergency? Prepare for an emergency by:  Knowing how to get to the nearest hospital.  Listing the phone numbers of your baby's health care providers near your home phone and in your cell phone.  How do I prepare my family?  Decide how to handle visitors.  If you have other children: ? Talk with them about the baby coming home. Ask them how they feel about it. ? Read a book together about being a new big brother or sister. ? Find ways to let them help you prepare for the new baby. ? Have someone ready to care for them while you are in the hospital. This information is not intended to replace advice given to you by your health care provider. Make sure you discuss any questions you have with your health care  provider. Document Released: 03/27/2008 Document Revised: 09/20/2015 Document Reviewed: 03/22/2014 Elsevier Interactive Patient Education  2018 [employer].  Contraception Choices Contraception, also called birth control, refers to methods or devices that prevent pregnancy. Hormonal methods Contraceptive implant A contraceptive implant is a thin, plastic tube that contains a hormone. It is inserted into the upper part of the arm. It can remain in place for up to 3 years. Progestin-only injections Progestin-only injections are injections of progestin, a synthetic form of the hormone progesterone. They are given every 3 months by a health care provider. Birth control pills Birth control pills are pills that contain hormones that prevent pregnancy. They must be taken once a day, preferably at the same time each day. Birth control patch The birth control patch contains hormones that prevent pregnancy. It is placed on the skin and must be changed once a week  for three weeks and removed on the fourth week. A prescription is needed to use this method of contraception. Vaginal ring A vaginal ring contains hormones that prevent pregnancy. It is placed in the vagina for three weeks and removed on the fourth week. After that, the process is repeated with a new ring. A prescription is needed to use this method of contraception. Emergency contraceptive Emergency contraceptives prevent pregnancy after unprotected sex. They come in pill form and can be taken up to 5 days after sex. They work best the sooner they are taken after having sex. Most emergency contraceptives are available without a prescription. This method should not be used as your only form of birth control. Barrier methods Female condom A female condom is a thin sheath that is worn over the penis during sex. Condoms keep sperm from going inside a woman's body. They can be used with a spermicide to increase their effectiveness. They should be  disposed after a single use. Female condom A female condom is a soft, loose-fitting sheath that is put into the vagina before sex. The condom keeps sperm from going inside a woman's body. They should be disposed after a single use. Diaphragm A diaphragm is a soft, dome-shaped barrier. It is inserted into the vagina before sex, along with a spermicide. The diaphragm blocks sperm from entering the uterus, and the spermicide kills sperm. A diaphragm should be left in the vagina for 6-8 hours after sex and removed within 24 hours. A diaphragm is prescribed and fitted by a health care provider. A diaphragm should be replaced every 1-2 years, after giving birth, after gaining more than 15 lb (6.8 kg), and after pelvic surgery. Cervical cap A cervical cap is a round, soft latex or plastic cup that fits over the cervix. It is inserted into the vagina before sex, along with spermicide. It blocks sperm from entering the uterus. The cap should be left in place for 6-8 hours after sex and removed within 48 hours. A cervical cap must be prescribed and fitted by a health care provider. It should be replaced every 2 years. Sponge A sponge is a soft, circular piece of polyurethane foam with spermicide on it. The sponge helps block sperm from entering the uterus, and the spermicide kills sperm. To use it, you make it wet and then insert it into the vagina. It should be inserted before sex, left in for at least 6 hours after sex, and removed and thrown away within 30 hours. Spermicides Spermicides are chemicals that kill or block sperm from entering the cervix and uterus. They can come as a cream, jelly, suppository, foam, or tablet. A spermicide should be inserted into the vagina with an applicator at least 10-15 minutes before sex to allow time for it to work. The process must be repeated every time you have sex. Spermicides do not require a prescription. Intrauterine contraception Intrauterine device (IUD) An IUD is  a T-shaped device that is put in a woman's uterus. There are two types:  Hormone IUD.This type contains progestin, a synthetic form of the hormone progesterone. This type can stay in place for 3-5 years.  Copper IUD.This type is wrapped in copper wire. It can stay in place for 10 years.  Permanent methods of contraception Female tubal ligation In this method, a woman's fallopian tubes are sealed, tied, or blocked during surgery to prevent eggs from traveling to the uterus. Hysteroscopic sterilization In this method, a small, flexible insert is placed into each fallopian  tube. The inserts cause scar tissue to form in the fallopian tubes and block them, so sperm cannot reach an egg. The procedure takes about 3 months to be effective. Another form of birth control must be used during those 3 months. Female sterilization This is a procedure to tie off the tubes that carry sperm (vasectomy). After the procedure, the man can still ejaculate fluid (semen). Natural planning methods Natural family planning In this method, a couple does not have sex on days when the woman could become pregnant. Calendar method This means keeping track of the length of each menstrual cycle, identifying the days when pregnancy can happen, and not having sex on those days. Ovulation method In this method, a couple avoids sex during ovulation. Symptothermal method This method involves not having sex during ovulation. The woman typically checks for ovulation by watching changes in her temperature and in the consistency of cervical mucus. Post-ovulation method In this method, a couple waits to have sex until after ovulation. Summary  Contraception, also called birth control, means methods or devices that prevent pregnancy.  Hormonal methods of contraception include implants, injections, pills, patches, vaginal rings, and emergency contraceptives.  Barrier methods of contraception can include female condoms, female condoms,  diaphragms, cervical caps, sponges, and spermicides.  There are two types of IUDs (intrauterine devices). An IUD can be put in a woman's uterus to prevent pregnancy for 3-5 years.  Permanent sterilization can be done through a procedure for males, females, or both.  Natural family planning methods involve not having sex on days when the woman could become pregnant. This information is not intended to replace advice given to you by your health care provider. Make sure you discuss any questions you have with your health care provider. Document Released: 04/14/2005 Document Revised: 05/17/2016 Document Reviewed: 05/17/2016 Elsevier Interactive Patient Education  2018 [employer].   AREA PEDIATRIC/FAMILY PRACTICE PHYSICIANS  [facility] [address] [place], [place]  [address] Phone - [phone]   Fax - [phone]  [facility] [address] [place], [place] [address] Phone - [phone]   Fax - [phone]  JACK AMOS [address] [place], [place]  [address] Phone - [phone]   Fax - [phone]  [facility] [address] [place], [place]  [address] Phone - [phone]   Fax - [phone]  [facility] [address] [place], [place]  [address] Phone - [phone]   Fax - [phone]  [facility] [address] [place], [place]  [address] Phone - [phone]   Fax - [phone]  [facility] [place] [address] [place], [place]  [address] Phone - [phone]   Fax - [phone]  [facility] [address] [place], [address] [place], [place]  [address] Phone - [phone]   Fax - [phone]  [facility] [address] [place], [place]  [address] Phone - [phone]   Fax - [phone] [facility] [place] [address] [place], [place]  [address] Phone - [phone]   Fax - [phone]  [facility] [address] [place], [place]  [address] Phone - [phone]   Fax - [phone]  [facility] [address] [place], [place]  [address] Phone - [phone]   Fax - [phone]  [facility] [address] [place], [place]  [address] Phone - [phone]   Fax - [phone]  [facility] [address] [place], [place]  [address] Phone - [phone]  [facility] [address] [place], [place]  [address] Phone - [phone]   Fax - [phone]  [facility] [address] [place], [place]  [address] Phone - [phone]   Fax - [phone]  [facility] [address] [place], [place]  [address] Phone - [phone]   Fax - [phone]  [facility] [address] [place], [place]  [address] Phone - [phone]   Fax - [phone]   [facility] [address] [place], [place]  [address] Phone - [phone]   Fax - [phone]  [facility] [address] [place], [place]  [address] Phone - [phone]   Fax - [phone]  DAVID RUBIN [address] [place], [place]  [address] Phone - [phone]   Fax - [phone]  [facility] [address] [place], [place]  [address] Phone - [phone]   Fax - [phone]  [facility] Al[facility] Chyle [address] [place], [place]  [address] Phone - [phone]   Fax - [phone] Gerarda Fraction [address] [place], [place]  [address] Phone - [phone]   Fax - [phone]  [facility] [address] [place], [place]  [address] Phone - [phone]   Fax - [phone]  [facility] - [place] [address] [place], [place]  [address] Phone - [phone]   Fax - [phone]  [facility] [place] Wyvonne Lenz MD [address] [place] [address] Phone [phone]  Fax [phone]

## 2017-10-26 ENCOUNTER — Ambulatory Visit (INDEPENDENT_AMBULATORY_CARE_PROVIDER_SITE_OTHER): Payer: Self-pay | Admitting: Pediatrics

## 2017-10-26 DIAGNOSIS — Z7681 Expectant parent(s) prebirth pediatrician visit: Secondary | ICD-10-CM

## 2017-10-26 NOTE — Progress Notes (Signed)
Prenatal counseling for impending newborn done. OB care started around [redacted] weeks gestation. No current health problems. Dr. Juanito DoomAgbuya will be primary.

## 2017-10-27 ENCOUNTER — Ambulatory Visit (INDEPENDENT_AMBULATORY_CARE_PROVIDER_SITE_OTHER): Payer: Managed Care, Other (non HMO) | Admitting: Certified Nurse Midwife

## 2017-10-27 ENCOUNTER — Encounter: Payer: Self-pay | Admitting: Certified Nurse Midwife

## 2017-10-27 DIAGNOSIS — Z3400 Encounter for supervision of normal first pregnancy, unspecified trimester: Secondary | ICD-10-CM

## 2017-10-27 DIAGNOSIS — Z3403 Encounter for supervision of normal first pregnancy, third trimester: Secondary | ICD-10-CM

## 2017-10-27 NOTE — Progress Notes (Signed)
   PRENATAL VISIT NOTE  Subjective:  Nichole Taylor is a 22 y.o. G1P0 at 6460w2d being seen today for ongoing prenatal care.  She is currently monitored for the following issues for this low-risk pregnancy and has Supervision of normal first pregnancy, antepartum; History of depression; and Pediatric pre-birth visit for expectant parent on their problem list.  Patient reports no complaints.  Contractions: Irritability. Vag. Bleeding: None.  Movement: Present. Denies leaking of fluid.   The following portions of the patient's history were reviewed and updated as appropriate: allergies, current medications, past family history, past medical history, past social history, past surgical history and problem list. Problem list updated.  Objective:   Vitals:   10/27/17 0839  BP: 137/83  Pulse: 93  Weight: 192 lb (87.1 kg)    Fetal Status: Fetal Heart Rate (bpm): 156; doppler Fundal Height: 31 cm Movement: Present     General:  Alert, oriented and cooperative. Patient is in no acute distress.  Skin: Skin is warm and dry. No rash noted.   Cardiovascular: Normal heart rate noted  Respiratory: Normal respiratory effort, no problems with respiration noted  Abdomen: Soft, gravid, appropriate for gestational age.  Pain/Pressure: Present     Pelvic: Cervical exam deferred        Extremities: Normal range of motion.  Edema: None  Mental Status: Normal mood and affect. Normal behavior. Normal judgment and thought content.   Assessment and Plan:  Pregnancy: G1P0 at 5460w2d  1. Supervision of normal first pregnancy, antepartum     [place]line blood pressure, states ate Donzetta Sprung[employer]ries last night.  Denies HA, changes in vision or upper gastric pain.    Preterm labor symptoms and general obstetric precautions including but not limited to vaginal bleeding, contractions, leaking of fluid and fetal movement were reviewed in detail with the patient. Please refer to After Visit Summary for other counseling  recommendations.  Return in about 2 weeks (around 11/10/2017) for ROB.  Future Appointments  Date Time Provider Department Center  11/09/2017 10:30 AM Burleson, Brand Maleserri L, NP [facility] None    Roe Coombsachelle A Jden Want, CNM

## 2017-11-09 ENCOUNTER — Other Ambulatory Visit (HOSPITAL_COMMUNITY)
Admission: RE | Admit: 2017-11-09 | Discharge: 2017-11-09 | Disposition: A | Discharge status: Home / Self Care | Payer: Managed Care, Other (non HMO) | Source: Ambulatory Visit | Attending: Nurse Practitioner | Admitting: Nurse Practitioner

## 2017-11-09 ENCOUNTER — Ambulatory Visit (INDEPENDENT_AMBULATORY_CARE_PROVIDER_SITE_OTHER): Payer: Managed Care, Other (non HMO) | Admitting: Nurse Practitioner

## 2017-11-09 VITALS — BP 118/81 | HR 87 | Wt 195.4 lb

## 2017-11-09 DIAGNOSIS — O26893 Other specified pregnancy related conditions, third trimester: Secondary | ICD-10-CM | POA: Diagnosis not present

## 2017-11-09 DIAGNOSIS — Z3400 Encounter for supervision of normal first pregnancy, unspecified trimester: Secondary | ICD-10-CM

## 2017-11-09 DIAGNOSIS — O9989 Other specified diseases and conditions complicating pregnancy, childbirth and the puerperium: Secondary | ICD-10-CM

## 2017-11-09 DIAGNOSIS — Z3A33 33 weeks gestation of pregnancy: Secondary | ICD-10-CM | POA: Diagnosis not present

## 2017-11-09 DIAGNOSIS — N898 Other specified noninflammatory disorders of vagina: Secondary | ICD-10-CM | POA: Diagnosis present

## 2017-11-09 DIAGNOSIS — Z3403 Encounter for supervision of normal first pregnancy, third trimester: Secondary | ICD-10-CM

## 2017-11-09 MED ORDER — COMFORT FIT MATERNITY SUPP MED MISC: 0 refills | Status: DC

## 2017-11-09 NOTE — Progress Notes (Signed)
    Subjective:  Nichole Taylor is a 22 y.o. G1P0 at 7180w1d being seen today for ongoing prenatal care.  She is currently monitored for the following issues for this low-risk pregnancy and has Supervision of normal first pregnancy, antepartum and History of depression on their problem list.  Patient reports feeling more pelvic pressure but not contractions.  worried about her vaginal discharge and is thinking she may have a yeast infection..  Contractions: Irritability. Vag. Bleeding: None.  Movement: Present. Denies leaking of fluid.   The following portions of the patient's history were reviewed and updated as appropriate: allergies, current medications, past family history, past medical history, past social history, past surgical history and problem list. Problem list updated.  Objective:   Vitals:   11/09/17 1042  BP: 118/81  Pulse: 87  Weight: 195 lb 6.4 oz (88.6 kg)    Fetal Status: Fetal Heart Rate (bpm): 158 Fundal Height: 34 cm Movement: Present     General:  Alert, oriented and cooperative. Patient is in no acute distress.  Skin: Skin is warm and dry. No rash noted.   Cardiovascular: Normal heart rate noted  Respiratory: Normal respiratory effort, no problems with respiration noted  Abdomen: Soft, gravid, appropriate for gestational age. Pain/Pressure: Absent     Pelvic:  Cervical exam deferred        Extremities: Normal range of motion.  Edema: Trace  Mental Status: Normal mood and affect. Normal behavior. Normal judgment and thought content.   Urinalysis:      Assessment and Plan:  Pregnancy: G1P0 at 5480w1d  1. Supervision of normal first pregnancy, antepartum Having pelvic pressure and thinks she may have a yeast infection.  Is not reporting contractions or vaginal bleeding or leaking.  Will check urine and vaginal discharge for infection.  Will prescribe maternity support belt. Encouraged to sign up for MyChart.  Preterm labor symptoms and general obstetric  precautions including but not limited to vaginal bleeding, contractions, leaking of fluid and fetal movement were reviewed in detail with the patient. Please refer to After Visit Summary for other counseling recommendations.  No follow-ups on file.  Nolene BernheimERRI Markayla Reichart, RN, MSN, NP-BC Nurse Practitioner, [facility], [facility] 11/09/2017 10:57 AM

## 2017-11-09 NOTE — Patient Instructions (Signed)

## 2017-11-09 NOTE — Progress Notes (Signed)
Patient reports good fetal movement with occasional uterine irritability. 

## 2017-11-10 LAB — CERVICOVAGINAL ANCILLARY ONLY
Bacterial vaginitis: NEGATIVE
CANDIDA VAGINITIS: NEGATIVE
Chlamydia: NEGATIVE
NEISSERIA GONORRHEA: NEGATIVE

## 2017-11-12 LAB — CULTURE, OB URINE

## 2017-11-12 LAB — URINE CULTURE, OB REFLEX

## 2017-11-23 ENCOUNTER — Ambulatory Visit (INDEPENDENT_AMBULATORY_CARE_PROVIDER_SITE_OTHER): Payer: Managed Care, Other (non HMO) | Admitting: Nurse Practitioner

## 2017-11-23 ENCOUNTER — Other Ambulatory Visit: Payer: Self-pay

## 2017-11-23 DIAGNOSIS — Z3400 Encounter for supervision of normal first pregnancy, unspecified trimester: Secondary | ICD-10-CM

## 2017-11-23 DIAGNOSIS — Z3403 Encounter for supervision of normal first pregnancy, third trimester: Secondary | ICD-10-CM

## 2017-11-23 NOTE — Patient Instructions (Signed)
Braxton Hicks Contractions °Contractions of the uterus can occur throughout pregnancy, but they are not always a sign that you are in labor. You may have practice contractions called Braxton Hicks contractions. These false labor contractions are sometimes confused with true labor. °What are Braxton Hicks contractions? °Braxton Hicks contractions are tightening movements that occur in the muscles of the uterus before labor. Unlike true labor contractions, these contractions do not result in opening (dilation) and thinning of the cervix. Toward the end of pregnancy (32-34 weeks), Braxton Hicks contractions can happen more often and may become stronger. These contractions are sometimes difficult to tell apart from true labor because they can be very uncomfortable. You should not feel embarrassed if you go to the hospital with false labor. °Sometimes, the only way to tell if you are in true labor is for your health care provider to look for changes in the cervix. The health care provider will do a physical exam and may monitor your contractions. If you are not in true labor, the exam should show that your cervix is not dilating and your water has not broken. °If there are other health problems associated with your pregnancy, it is completely safe for you to be sent home with false labor. You may continue to have Braxton Hicks contractions until you go into true labor. °How to tell the difference between true labor and false labor °True labor °· Contractions last 30-70 seconds. °· Contractions become very regular. °· Discomfort is usually felt in the top of the uterus, and it spreads to the lower abdomen and low back. °· Contractions do not go away with walking. °· Contractions usually become more intense and increase in frequency. °· The cervix dilates and gets thinner. °False labor °· Contractions are usually shorter and not as strong as true labor contractions. °· Contractions are usually irregular. °· Contractions  are often felt in the front of the lower abdomen and in the groin. °· Contractions may go away when you walk around or change positions while lying down. °· Contractions get weaker and are shorter-lasting as time goes on. °· The cervix usually does not dilate or become thin. °Follow these instructions at home: °· Take over-the-counter and prescription medicines only as told by your health care provider. °· Keep up with your usual exercises and follow other instructions from your health care provider. °· Eat and drink lightly if you think you are going into labor. °· If Braxton Hicks contractions are making you uncomfortable: °? Change your position from lying down or resting to walking, or change from walking to resting. °? Sit and rest in a tub of warm water. °? Drink enough fluid to keep your urine pale yellow. Dehydration may cause these contractions. °? Do slow and deep breathing several times an hour. °· Keep all follow-up prenatal visits as told by your health care provider. This is important. °Contact a health care provider if: °· You have a fever. °· You have continuous pain in your abdomen. °Get help right away if: °· Your contractions become stronger, more regular, and closer together. °· You have fluid leaking or gushing from your vagina. °· You pass blood-tinged mucus (bloody show). °· You have bleeding from your vagina. °· You have low back pain that you never had before. °· You feel your baby’s head pushing down and causing pelvic pressure. °· Your baby is not moving inside you as much as it used to. °Summary °· Contractions that occur before labor are called Braxton   Hicks contractions, false labor, or practice contractions. °· Braxton Hicks contractions are usually shorter, weaker, farther apart, and less regular than true labor contractions. True labor contractions usually become progressively stronger and regular and they become more frequent. °· Manage discomfort from Braxton Hicks contractions by  changing position, resting in a warm bath, drinking plenty of water, or practicing deep breathing. °This information is not intended to replace advice given to you by your health care provider. Make sure you discuss any questions you have with your health care provider. °Document Released: 08/28/2016 Document Revised: 08/28/2016 Document Reviewed: 08/28/2016 °Elsevier Interactive Patient Education © 2018 [employer] ° °

## 2017-11-23 NOTE — Progress Notes (Signed)
ROB, no complaints today.

## 2017-11-23 NOTE — Progress Notes (Signed)
    Subjective:  Nichole Taylor is a 22 y.o. G1P0 at 9878w1d being seen today for ongoing prenatal care.  She is currently monitored for the following issues for this low-risk pregnancy and has Supervision of normal first pregnancy, antepartum and History of depression on their problem list.   Partner is with her for the visit today.  Both are smiling and talkative. Went to prenatal classes.  Patient reports no complaints.  Contractions: Irritability. Vag. Bleeding: None.  Movement: Present. Denies leaking of fluid.   The following portions of the patient's history were reviewed and updated as appropriate: allergies, current medications, past family history, past medical history, past social history, past surgical history and problem list. Problem list updated.  Objective:   Vitals:   11/23/17 0933  BP: 119/76  Pulse: 90  Weight: 198 lb 6.4 oz (90 kg)    Fetal Status: Fetal Heart Rate (bpm): 152 Fundal Height: 37 cm Movement: Present     General:  Alert, oriented and cooperative. Patient is in no acute distress.  Skin: Skin is warm and dry. No rash noted.   Cardiovascular: Normal heart rate noted  Respiratory: Normal respiratory effort, no problems with respiration noted  Abdomen: Soft, gravid, appropriate for gestational age. Pain/Pressure: Present     Pelvic:  Cervical exam deferred        Extremities: Normal range of motion.  Edema: Trace  Mental Status: Normal mood and affect. Normal behavior. Normal judgment and thought content.   Urinalysis:      Assessment and Plan:  Pregnancy: G1P0 at 5978w1d  1. Supervision of normal first pregnancy, antepartum Based on previous history of depression in college (no meds, no therapy), discussed possibility of postpartum baby blues, postpartum depression and postpartum psychosis. Did not get belly support band - not needed.' Encouraged again to sign up for MyChart.  Preterm labor symptoms and general obstetric precautions including but  not limited to vaginal bleeding, contractions, leaking of fluid and fetal movement were reviewed in detail with the patient. Please refer to After Visit Summary for other counseling recommendations.  Return in about 1 week (around 11/30/2017).  Nolene BernheimERRI Estle Sabella, RN, MSN, NP-BC Nurse Practitioner, [facility] [employer], [facility] 11/23/2017 9:58 AM

## 2017-12-07 ENCOUNTER — Ambulatory Visit (INDEPENDENT_AMBULATORY_CARE_PROVIDER_SITE_OTHER): Payer: Managed Care, Other (non HMO) | Admitting: Nurse Practitioner

## 2017-12-07 ENCOUNTER — Encounter: Payer: Managed Care, Other (non HMO) | Admitting: Obstetrics

## 2017-12-07 ENCOUNTER — Other Ambulatory Visit (HOSPITAL_COMMUNITY)
Admission: RE | Admit: 2017-12-07 | Discharge: 2017-12-07 | Disposition: A | Discharge status: Home / Self Care | Payer: Managed Care, Other (non HMO) | Source: Ambulatory Visit | Attending: Obstetrics | Admitting: Obstetrics

## 2017-12-07 VITALS — BP 120/81 | HR 96 | Wt 200.0 lb

## 2017-12-07 DIAGNOSIS — Z3403 Encounter for supervision of normal first pregnancy, third trimester: Secondary | ICD-10-CM | POA: Diagnosis present

## 2017-12-07 DIAGNOSIS — Z3A37 37 weeks gestation of pregnancy: Secondary | ICD-10-CM | POA: Insufficient documentation

## 2017-12-07 DIAGNOSIS — Z3400 Encounter for supervision of normal first pregnancy, unspecified trimester: Secondary | ICD-10-CM

## 2017-12-07 NOTE — Progress Notes (Signed)
    Subjective:  Nichole Taylor is a 22 y.o. G1P0 at 7183w1d being seen today for ongoing prenatal care.  She is currently monitored for the following issues for this low-risk pregnancy and has Supervision of normal first pregnancy, antepartum and History of depression on their problem list.  Patient reports small amount of internal genital itching.  Contractions: Irregular. Vag. Bleeding: None.  Movement: Present. Denies leaking of fluid.   The following portions of the patient's history were reviewed and updated as appropriate: allergies, current medications, past family history, past medical history, past social history, past surgical history and problem list. Problem list updated.  Objective:   Vitals:   12/07/17 1134  BP: 120/81  Pulse: 96  Weight: 200 lb (90.7 kg)    Fetal Status: Fetal Heart Rate (bpm): 156 Fundal Height: 39 cm Movement: Present     General:  Alert, oriented and cooperative. Patient is in no acute distress.  Skin: Skin is warm and dry. No rash noted.   Cardiovascular: Normal heart rate noted  Respiratory: Normal respiratory effort, no problems with respiration noted  Abdomen: Soft, gravid, appropriate for gestational age. Pain/Pressure: Present     Pelvic:  Cervical exam deferredGC/chlam, GBS swabs done      No redness seen and no abdomal discharge seen on inspection of vulva and introitus  Extremities: Normal range of motion.  Edema: Trace  Mental Status: Normal mood and affect. Normal behavior. Normal judgment and thought content.   Urinalysis:      Assessment and Plan:  Pregnancy: G1P0 at 3083w1d  1. Supervision of normal first pregnancy, antepartum Doing well.   - Strep Gp B NAA - Cervicovaginal ancillary only  Term labor symptoms and general obstetric precautions including but not limited to vaginal bleeding, contractions, leaking of fluid and fetal movement were reviewed in detail with the patient. Please refer to After Visit Summary for other  counseling recommendations.  Return in about 1 week (around 12/14/2017).  Nolene BernheimERRI Signa Cheek, RN, MSN, NP-BC Nurse Practitioner, [facility] [employer], [facility] 12/07/2017 11:59 AM

## 2017-12-08 LAB — CERVICOVAGINAL ANCILLARY ONLY
BACTERIAL VAGINITIS: NEGATIVE
CANDIDA VAGINITIS: POSITIVE — AB
Chlamydia: NEGATIVE
Neisseria Gonorrhea: NEGATIVE
TRICH (WINDOWPATH): NEGATIVE

## 2017-12-08 MED ORDER — TERCONAZOLE 0.4 % VA CREA: 1.0000 | TOPICAL_CREAM | Freq: Every day | VAGINAL | 0 refills | Status: DC

## 2017-12-08 NOTE — Progress Notes (Signed)
Lab results show persistent yeast.  Sent Terazol to the pharmacy and MyChart note to client.   Nolene BernheimERRI BURLESON, RN, MSN, NP-BC Nurse Practitioner, [facility], [facility] 12/08/2017 8:39 PM

## 2017-12-08 NOTE — Addendum Note (Signed)
Addended by: Currie ParisBURLESON, Yuki Purves L on: 12/08/2017 08:40 PM   Modules accepted: Orders

## 2017-12-09 ENCOUNTER — Telehealth: Payer: Self-pay

## 2017-12-09 LAB — STREP GP B NAA: STREP GROUP B AG: NEGATIVE

## 2017-12-09 NOTE — Telephone Encounter (Signed)
TC from patient requesting pill medication instead of cream for yeast please advise.  Pt was seen 12/07/17.

## 2017-12-10 NOTE — Telephone Encounter (Signed)
Pt made aware. Pt voiced understanding.

## 2017-12-14 ENCOUNTER — Ambulatory Visit (INDEPENDENT_AMBULATORY_CARE_PROVIDER_SITE_OTHER): Payer: Managed Care, Other (non HMO) | Admitting: Advanced Practice Midwife

## 2017-12-14 ENCOUNTER — Encounter: Payer: Self-pay | Admitting: Advanced Practice Midwife

## 2017-12-14 VITALS — BP 132/88 | HR 95 | Wt 201.6 lb

## 2017-12-14 DIAGNOSIS — Z3403 Encounter for supervision of normal first pregnancy, third trimester: Secondary | ICD-10-CM

## 2017-12-14 DIAGNOSIS — B3731 Acute candidiasis of vulva and vagina: Secondary | ICD-10-CM

## 2017-12-14 DIAGNOSIS — Z3400 Encounter for supervision of normal first pregnancy, unspecified trimester: Secondary | ICD-10-CM

## 2017-12-14 DIAGNOSIS — B373 Candidiasis of vulva and vagina: Secondary | ICD-10-CM

## 2017-12-14 DIAGNOSIS — Z8659 Personal history of other mental and behavioral disorders: Secondary | ICD-10-CM

## 2017-12-14 NOTE — Progress Notes (Signed)
   PRENATAL VISIT NOTE  Subjective:  Nichole Taylor is a 22 y.o. G1P0 at 9633w1d being seen today for ongoing prenatal care.  She is currently monitored for the following issues for this low-risk pregnancy and has Supervision of normal first pregnancy, antepartum and History of depression on their problem list.  Patient reports no complaints.  Contractions: Not present.  .  Movement: Present. Denies leaking of fluid.   The following portions of the patient's history were reviewed and updated as appropriate: allergies, current medications, past family history, past medical history, past social history, past surgical history and problem list. Problem list updated.  Objective:   Vitals:   12/14/17 1350  BP: 132/88  Pulse: 95  Weight: 201 lb 9.6 oz (91.4 kg)    Fetal Status: Fetal Heart Rate (bpm): 146   Movement: Present     General:  Alert, oriented and cooperative. Patient is in no acute distress.  Skin: Skin is warm and dry. No rash noted.   Cardiovascular: Normal heart rate noted  Respiratory: Normal respiratory effort, no problems with respiration noted  Abdomen: Soft, gravid, appropriate for gestational age.  Pain/Pressure: Present     Pelvic: Cervical exam deferred        Extremities: Normal range of motion.  Edema: Trace  Mental Status: Normal mood and affect. Normal behavior. Normal judgment and thought content.   Assessment and Plan:  Pregnancy: G1P0 at 5333w1d  1. Encounter for supervision of normal first pregnancy in third trimester --No complaints or concerns, continue routine care --Confirmed patient knows location of MAU --Discussed 5/1/1 rule of authentic labor contractions --Schedule IOL during next visit PRN  2. Vulvovaginal candidiasis --Two days left of prescribed Terazol.  --Reports relief of symptoms, just annoyed with vaginal cream vs pill  3. History of depression --Feeling positive about pregnancy, excited to meet daughter   Term labor symptoms and  general obstetric precautions including but not limited to vaginal bleeding, contractions, leaking of fluid and fetal movement were reviewed in detail with the patient. Please refer to After Visit Summary for other counseling recommendations.  Return in about 1 week (around 12/21/2017).  No future appointments.  Calvert CantorSamantha C Weinhold, CNM

## 2017-12-14 NOTE — Progress Notes (Signed)
Pt presents for ROB without complaint today.

## 2017-12-14 NOTE — Patient Instructions (Signed)
Vaginal Delivery Vaginal delivery means that you will give birth by pushing your baby out of your birth canal (vagina). A team of health care providers will help you before, during, and after vaginal delivery. Birth experiences are unique for every woman and every pregnancy, and birth experiences vary depending on where you choose to give birth. What should I do to prepare for my baby's birth? Before your baby is born, it is important to talk with your health care provider about:  Your labor and delivery preferences. These may include: ? Medicines that you may be given. ? How you will manage your pain. This might include non-medical pain relief techniques or injectable pain relief such as epidural analgesia. ? How you and your baby will be monitored during labor and delivery. ? Who may be in the labor and delivery room with you. ? Your feelings about surgical delivery of your baby (cesarean delivery, or C-section) if this becomes necessary. ? Your feelings about receiving donated blood through an IV tube (blood transfusion) if this becomes necessary.  Whether you are able: ? To take pictures or videos of the birth. ? To eat during labor and delivery. ? To move around, walk, or change positions during labor and delivery.  What to expect after your baby is born, such as: ? Whether delayed umbilical cord clamping and cutting is offered. ? Who will care for your baby right after birth. ? Medicines or tests that may be recommended for your baby. ? Whether breastfeeding is supported in your hospital or birth center. ? How long you will be in the hospital or birth center.  How any medical conditions you have may affect your baby or your labor and delivery experience.  To prepare for your baby's birth, you should also:  Attend all of your health care visits before delivery (prenatal visits) as recommended by your health care provider. This is important.  Prepare your home for your baby's  arrival. Make sure that you have: ? Diapers. ? Baby clothing. ? Feeding equipment. ? Safe sleeping arrangements for you and your baby.  Install a car seat in your vehicle. Have your car seat checked by a certified car seat installer to make sure that it is installed safely.  Think about who will help you with your new baby at home for at least the first several weeks after delivery.  What can I expect when I arrive at the birth center or hospital? Once you are in labor and have been admitted into the hospital or birth center, your health care provider may:  Review your pregnancy history and any concerns you have.  Insert an IV tube into one of your veins. This is used to give you fluids and medicines.  Check your blood pressure, pulse, temperature, and heart rate (vital signs).  Check whether your bag of water (amniotic sac) has broken (ruptured).  Talk with you about your birth plan and discuss pain control options.  Monitoring Your health care provider may monitor your contractions (uterine monitoring) and your baby's heart rate (fetal monitoring). You may need to be monitored:  Often, but not continuously (intermittently).  All the time or for long periods at a time (continuously). Continuous monitoring may be needed if: ? You are taking certain medicines, such as medicine to relieve pain or make your contractions stronger. ? You have pregnancy or labor complications.  Monitoring may be done by:  Placing a special stethoscope or a handheld monitoring device on your abdomen to   check your baby's heartbeat, and feeling your abdomen for contractions. This method of monitoring does not continuously record your baby's heartbeat or your contractions.  Placing monitors on your abdomen (external monitors) to record your baby's heartbeat and the frequency and length of contractions. You may not have to wear external monitors all the time.  Placing monitors inside of your uterus  (internal monitors) to record your baby's heartbeat and the frequency, length, and strength of your contractions. ? Your health care provider may use internal monitors if he or she needs more information about the strength of your contractions or your baby's heart rate. ? Internal monitors are put in place by passing a thin, flexible wire through your vagina and into your uterus. Depending on the type of monitor, it may remain in your uterus or on your baby's head until birth. ? Your health care provider will discuss the benefits and risks of internal monitoring with you and will ask for your permission before inserting the monitors.  Telemetry. This is a type of continuous monitoring that can be done with external or internal monitors. Instead of having to stay in bed, you are able to move around during telemetry. Ask your health care provider if telemetry is an option for you.  Physical exam Your health care provider may perform a physical exam. This may include:  Checking whether your baby is positioned: ? With the head toward your vagina (head-down). This is most common. ? With the head toward the top of your uterus (head-up or breech). If your baby is in a breech position, your health care provider may try to turn your baby to a head-down position so you can deliver vaginally. If it does not seem that your baby can be born vaginally, your provider may recommend surgery to deliver your baby. In rare cases, you may be able to deliver vaginally if your baby is head-up (breech delivery). ? Lying sideways (transverse). Babies that are lying sideways cannot be delivered vaginally.  Checking your cervix to determine: ? Whether it is thinning out (effacing). ? Whether it is opening up (dilating). ? How low your baby has moved into your birth canal.  What are the three stages of labor and delivery?  Normal labor and delivery is divided into the following three stages: Stage 1  Stage 1 is the  longest stage of labor, and it can last for hours or days. Stage 1 includes: ? Early labor. This is when contractions may be irregular, or regular and mild. Generally, early labor contractions are more than 10 minutes apart. ? Active labor. This is when contractions get longer, more regular, more frequent, and more intense. ? The transition phase. This is when contractions happen very close together, are very intense, and may last longer than during any other part of labor.  Contractions generally feel mild, infrequent, and irregular at first. They get stronger, more frequent (about every 2-3 minutes), and more regular as you progress from early labor through active labor and transition.  Many women progress through stage 1 naturally, but you may need help to continue making progress. If this happens, your health care provider may talk with you about: ? Rupturing your amniotic sac if it has not ruptured yet. ? Giving you medicine to help make your contractions stronger and more frequent.  Stage 1 ends when your cervix is completely dilated to 4 inches (10 cm) and completely effaced. This happens at the end of the transition phase. Stage 2  Once   your cervix is completely effaced and dilated to 4 inches (10 cm), you may start to feel an urge to push. It is common for the body to naturally take a rest before feeling the urge to push, especially if you received an epidural or certain other pain medicines. This rest period may last for up to 1-2 hours, depending on your unique labor experience.  During stage 2, contractions are generally less painful, because pushing helps relieve contraction pain. Instead of contraction pain, you may feel stretching and burning pain, especially when the widest part of your baby's head passes through the vaginal opening (crowning).  Your health care provider will closely monitor your pushing progress and your baby's progress through the vagina during stage 2.  Your  health care provider may massage the area of skin between your vaginal opening and anus (perineum) or apply warm compresses to your perineum. This helps it stretch as the baby's head starts to crown, which can help prevent perineal tearing. ? In some cases, an incision may be made in your perineum (episiotomy) to allow the baby to pass through the vaginal opening. An episiotomy helps to make the opening of the vagina larger to allow more room for the baby to fit through.  It is very important to breathe and focus so your health care provider can control the delivery of your baby's head. Your health care provider may have you decrease the intensity of your pushing, to help prevent perineal tearing.  After delivery of your baby's head, the shoulders and the rest of the body generally deliver very quickly and without difficulty.  Once your baby is delivered, the umbilical cord may be cut right away, or this may be delayed for 1-2 minutes, depending on your baby's health. This may vary among health care providers, hospitals, and birth centers.  If you and your baby are healthy enough, your baby may be placed on your chest or abdomen to help maintain the baby's temperature and to help you bond with each other. Some mothers and babies start breastfeeding at this time. Your health care team will dry your baby and help keep your baby warm during this time.  Your baby may need immediate care if he or she: ? Showed signs of distress during labor. ? Has a medical condition. ? Was born too early (prematurely). ? Had a bowel movement before birth (meconium). ? Shows signs of difficulty transitioning from being inside the uterus to being outside of the uterus. If you are planning to breastfeed, your health care team will help you begin a feeding. Stage 3  The third stage of labor starts immediately after the birth of your baby and ends after you deliver the placenta. The placenta is an organ that develops  during pregnancy to provide oxygen and nutrients to your baby in the womb.  Delivering the placenta may require some pushing, and you may have mild contractions. Breastfeeding can stimulate contractions to help you deliver the placenta.  After the placenta is delivered, your uterus should tighten (contract) and become firm. This helps to stop bleeding in your uterus. To help your uterus contract and to control bleeding, your health care provider may: ? Give you medicine by injection, through an IV tube, by mouth, or through your rectum (rectally). ? Massage your abdomen or perform a vaginal exam to remove any blood clots that are left in your uterus. ? Empty your bladder by placing a thin, flexible tube (catheter) into your bladder. ? Encourage   you to breastfeed your baby. After labor is over, you and your baby will be monitored closely to ensure that you are both healthy until you are ready to go home. Your health care team will teach you how to care for yourself and your baby. This information is not intended to replace advice given to you by your health care provider. Make sure you discuss any questions you have with your health care provider. Document Released: 01/22/2008 Document Revised: 11/02/2015 Document Reviewed: 04/29/2015 [employer]vier Interactive Patient Education  2018 [employer]  

## 2017-12-22 ENCOUNTER — Ambulatory Visit (INDEPENDENT_AMBULATORY_CARE_PROVIDER_SITE_OTHER): Payer: Managed Care, Other (non HMO) | Admitting: Advanced Practice Midwife

## 2017-12-22 VITALS — BP 125/83 | HR 96 | Wt 201.7 lb

## 2017-12-22 DIAGNOSIS — Z3403 Encounter for supervision of normal first pregnancy, third trimester: Secondary | ICD-10-CM

## 2017-12-22 NOTE — Progress Notes (Signed)
   PRENATAL VISIT NOTE  Subjective:  Nichole Taylor is a 22 y.o. G1P0 at 4134w2d being seen today for ongoing prenatal care.  She is currently monitored for the following issues for this low-risk pregnancy and has Supervision of normal first pregnancy, antepartum and History of depression on their problem list.  Patient reports occasional contractions.  Contractions: Irregular. Vag. Bleeding: None.  Movement: Present. Denies leaking of fluid.   The following portions of the patient's history were reviewed and updated as appropriate: allergies, current medications, past family history, past medical history, past social history, past surgical history and problem list. Problem list updated.  Objective:   Vitals:   12/22/17 1441  BP: 125/83  Pulse: 96  Weight: 91.5 kg    Fetal Status: Fetal Heart Rate (bpm): 160   Movement: Present  Presentation: Vertex  General:  Alert, oriented and cooperative. Patient is in no acute distress.  Skin: Skin is warm and dry. No rash noted.   Cardiovascular: Normal heart rate noted  Respiratory: Normal respiratory effort, no problems with respiration noted  Abdomen: Soft, gravid, appropriate for gestational age.  Pain/Pressure: Present     Pelvic: Cervical exam performed Dilation: 1 Effacement (%): 80 Station: -2  Extremities: Normal range of motion.  Edema: None  Mental Status: Normal mood and affect. Normal behavior. Normal judgment and thought content.   Assessment and Plan:  Pregnancy: G1P0 at 6334w2d  1. Encounter for supervision of normal first pregnancy in third trimester --Anticipatory guidance about next visits/weeks of pregnancy given. --Membranes swept at pt request.   --Discussed plan for NST at next visit, IOL at 41 weeks if labor does not begin on its own.   Term labor symptoms and general obstetric precautions including but not limited to vaginal bleeding, contractions, leaking of fluid and fetal movement were reviewed in detail with the  patient. Please refer to After Visit Summary for other counseling recommendations.  Return in about 1 week (around 12/29/2017).  Future Appointments  Date Time Provider Department Center  12/29/2017  1:30 PM Reva BoresPratt, Tanya S, MD [facility] None    Sharen CounterLisa Leftwich-Kirby, CNM

## 2017-12-22 NOTE — Patient Instructions (Signed)
Labor Precautions Reasons to come to [facility]:  1.  Contractions are  5 minutes apart or less, each last 1 minute, these have been going on for 1-2 hours, and you cannot walk or talk during them 2.  You have a large gush of fluid, or a trickle of fluid that will not stop and you have to wear a pad 3.  You have bleeding that is bright red, heavier than spotting--like menstrual bleeding (spotting can be normal in early labor or after a check of your cervix) 4.  You do not feel the baby moving like he/she normally does  Vaginal Delivery Vaginal delivery means that you will give birth by pushing your baby out of your birth canal (vagina). A team of health care providers will help you before, during, and after vaginal delivery. Birth experiences are unique for every woman and every pregnancy, and birth experiences vary depending on where you choose to give birth. What should I do to prepare for my baby's birth? Before your baby is born, it is important to talk with your health care provider about:  Your labor and delivery preferences. These may include: ? Medicines that you may be given. ? How you will manage your pain. This might include non-medical pain relief techniques or injectable pain relief such as epidural analgesia. ? How you and your baby will be monitored during labor and delivery. ? Who may be in the labor and delivery room with you. ? Your feelings about surgical delivery of your baby (cesarean delivery, or C-section) if this becomes necessary. ? Your feelings about receiving donated blood through an IV tube (blood transfusion) if this becomes necessary.  Whether you are able: ? To take pictures or videos of the birth. ? To eat during labor and delivery. ? To move around, walk, or change positions during labor and delivery.  What to expect after your baby is born, such as: ? Whether delayed umbilical cord clamping and cutting is offered. ? Who will care for your baby right after  birth. ? Medicines or tests that may be recommended for your baby. ? Whether breastfeeding is supported in your hospital or birth center. ? How long you will be in the hospital or birth center.  How any medical conditions you have may affect your baby or your labor and delivery experience.  To prepare for your baby's birth, you should also:  Attend all of your health care visits before delivery (prenatal visits) as recommended by your health care provider. This is important.  Prepare your home for your baby's arrival. Make sure that you have: ? Diapers. ? Baby clothing. ? Feeding equipment. ? Safe sleeping arrangements for you and your baby.  Install a car seat in your vehicle. Have your car seat checked by a certified car seat installer to make sure that it is installed safely.  Think about who will help you with your new baby at home for at least the first several weeks after delivery.  What can I expect when I arrive at the birth center or hospital? Once you are in labor and have been admitted into the hospital or birth center, your health care provider may:  Review your pregnancy history and any concerns you have.  Insert an IV tube into one of your veins. This is used to give you fluids and medicines.  Check your blood pressure, pulse, temperature, and heart rate (vital signs).  Check whether your bag of water (amniotic sac) has broken (ruptured).  Talk with you about your birth plan and discuss pain control options.  Monitoring Your health care provider may monitor your contractions (uterine monitoring) and your baby's heart rate (fetal monitoring). You may need to be monitored:  Often, but not continuously (intermittently).  All the time or for long periods at a time (continuously). Continuous monitoring may be needed if: ? You are taking certain medicines, such as medicine to relieve pain or make your contractions stronger. ? You have pregnancy or labor  complications.  Monitoring may be done by:  Placing a special stethoscope or a handheld monitoring device on your abdomen to check your baby's heartbeat, and feeling your abdomen for contractions. This method of monitoring does not continuously record your baby's heartbeat or your contractions.  Placing monitors on your abdomen (external monitors) to record your baby's heartbeat and the frequency and length of contractions. You may not have to wear external monitors all the time.  Placing monitors inside of your uterus (internal monitors) to record your baby's heartbeat and the frequency, length, and strength of your contractions. ? Your health care provider may use internal monitors if he or she needs more information about the strength of your contractions or your baby's heart rate. ? Internal monitors are put in place by passing a thin, flexible wire through your vagina and into your uterus. Depending on the type of monitor, it may remain in your uterus or on your baby's head until birth. ? Your health care provider will discuss the benefits and risks of internal monitoring with you and will ask for your permission before inserting the monitors.  Telemetry. This is a type of continuous monitoring that can be done with external or internal monitors. Instead of having to stay in bed, you are able to move around during telemetry. Ask your health care provider if telemetry is an option for you.  Physical exam Your health care provider may perform a physical exam. This may include:  Checking whether your baby is positioned: ? With the head toward your vagina (head-down). This is most common. ? With the head toward the top of your uterus (head-up or breech). If your baby is in a breech position, your health care provider may try to turn your baby to a head-down position so you can deliver vaginally. If it does not seem that your baby can be born vaginally, your provider may recommend surgery to  deliver your baby. In rare cases, you may be able to deliver vaginally if your baby is head-up (breech delivery). ? Lying sideways (transverse). Babies that are lying sideways cannot be delivered vaginally.  Checking your cervix to determine: ? Whether it is thinning out (effacing). ? Whether it is opening up (dilating). ? How low your baby has moved into your birth canal.  What are the three stages of labor and delivery?  Normal labor and delivery is divided into the following three stages: Stage 1  Stage 1 is the longest stage of labor, and it can last for hours or days. Stage 1 includes: ? Early labor. This is when contractions may be irregular, or regular and mild. Generally, early labor contractions are more than 10 minutes apart. ? Active labor. This is when contractions get longer, more regular, more frequent, and more intense. ? The transition phase. This is when contractions happen very close together, are very intense, and may last longer than during any other part of labor.  Contractions generally feel mild, infrequent, and irregular at first. They get  stronger, more frequent (about every 2-3 minutes), and more regular as you progress from early labor through active labor and transition.  Many women progress through stage 1 naturally, but you may need help to continue making progress. If this happens, your health care provider may talk with you about: ? Rupturing your amniotic sac if it has not ruptured yet. ? Giving you medicine to help make your contractions stronger and more frequent.  Stage 1 ends when your cervix is completely dilated to 4 inches (10 cm) and completely effaced. This happens at the end of the transition phase. Stage 2  Once your cervix is completely effaced and dilated to 4 inches (10 cm), you may start to feel an urge to push. It is common for the body to naturally take a rest before feeling the urge to push, especially if you received an epidural or  certain other pain medicines. This rest period may last for up to 1-2 hours, depending on your unique labor experience.  During stage 2, contractions are generally less painful, because pushing helps relieve contraction pain. Instead of contraction pain, you may feel stretching and burning pain, especially when the widest part of your baby's head passes through the vaginal opening (crowning).  Your health care provider will closely monitor your pushing progress and your baby's progress through the vagina during stage 2.  Your health care provider may massage the area of skin between your vaginal opening and anus (perineum) or apply warm compresses to your perineum. This helps it stretch as the baby's head starts to crown, which can help prevent perineal tearing. ? In some cases, an incision may be made in your perineum (episiotomy) to allow the baby to pass through the vaginal opening. An episiotomy helps to make the opening of the vagina larger to allow more room for the baby to fit through.  It is very important to breathe and focus so your health care provider can control the delivery of your baby's head. Your health care provider may have you decrease the intensity of your pushing, to help prevent perineal tearing.  After delivery of your baby's head, the shoulders and the rest of the body generally deliver very quickly and without difficulty.  Once your baby is delivered, the umbilical cord may be cut right away, or this may be delayed for 1-2 minutes, depending on your baby's health. This may vary among health care providers, hospitals, and birth centers.  If you and your baby are healthy enough, your baby may be placed on your chest or abdomen to help maintain the baby's temperature and to help you bond with each other. Some mothers and babies start breastfeeding at this time. Your health care team will dry your baby and help keep your baby warm during this time.  Your baby may need immediate  care if he or she: ? Showed signs of distress during labor. ? Has a medical condition. ? Was born too early (prematurely). ? Had a bowel movement before birth (meconium). ? Shows signs of difficulty transitioning from being inside the uterus to being outside of the uterus. If you are planning to breastfeed, your health care team will help you begin a feeding. Stage 3  The third stage of labor starts immediately after the birth of your baby and ends after you deliver the placenta. The placenta is an organ that develops during pregnancy to provide oxygen and nutrients to your baby in the womb.  Delivering the placenta may require some pushing,  and you may have mild contractions. Breastfeeding can stimulate contractions to help you deliver the placenta.  After the placenta is delivered, your uterus should tighten (contract) and become firm. This helps to stop bleeding in your uterus. To help your uterus contract and to control bleeding, your health care provider may: ? Give you medicine by injection, through an IV tube, by mouth, or through your rectum (rectally). ? Massage your abdomen or perform a vaginal exam to remove any blood clots that are left in your uterus. ? Empty your bladder by placing a thin, flexible tube (catheter) into your bladder. ? Encourage you to breastfeed your baby. After labor is over, you and your baby will be monitored closely to ensure that you are both healthy until you are ready to go home. Your health care team will teach you how to care for yourself and your baby. This information is not intended to replace advice given to you by your health care provider. Make sure you discuss any questions you have with your health care provider. Document Released: 01/22/2008 Document Revised: 11/02/2015 Document Reviewed: 04/29/2015 Elsevier Interactive Patient Education  2018 [employer].

## 2017-12-22 NOTE — Addendum Note (Signed)
Addended by: Sharen CounterLEFTWICH-KIRBY, Mariyanna Mucha A on: 12/22/2017 03:22 PM   Modules accepted: Orders, SmartSet

## 2017-12-28 ENCOUNTER — Inpatient Hospital Stay (HOSPITAL_COMMUNITY): Payer: Managed Care, Other (non HMO) | Admitting: Anesthesiology

## 2017-12-28 ENCOUNTER — Other Ambulatory Visit: Payer: Self-pay

## 2017-12-28 ENCOUNTER — Encounter (HOSPITAL_COMMUNITY): Payer: Self-pay | Admitting: *Deleted

## 2017-12-28 ENCOUNTER — Inpatient Hospital Stay (HOSPITAL_COMMUNITY)
Admission: AD | Admit: 2017-12-28 | Discharge: 2017-12-30 | DRG: 807 | Disposition: A | Discharge status: Home / Self Care | Payer: Managed Care, Other (non HMO) | Attending: Obstetrics and Gynecology | Admitting: Obstetrics and Gynecology

## 2017-12-28 DIAGNOSIS — O4292 Full-term premature rupture of membranes, unspecified as to length of time between rupture and onset of labor: Principal | ICD-10-CM | POA: Diagnosis present

## 2017-12-28 DIAGNOSIS — O480 Post-term pregnancy: Secondary | ICD-10-CM | POA: Diagnosis present

## 2017-12-28 DIAGNOSIS — Z8659 Personal history of other mental and behavioral disorders: Secondary | ICD-10-CM

## 2017-12-28 DIAGNOSIS — O134 Gestational [pregnancy-induced] hypertension without significant proteinuria, complicating childbirth: Secondary | ICD-10-CM | POA: Diagnosis present

## 2017-12-28 DIAGNOSIS — Z3A40 40 weeks gestation of pregnancy: Secondary | ICD-10-CM

## 2017-12-28 DIAGNOSIS — Z3A41 41 weeks gestation of pregnancy: Secondary | ICD-10-CM

## 2017-12-28 DIAGNOSIS — O4202 Full-term premature rupture of membranes, onset of labor within 24 hours of rupture: Secondary | ICD-10-CM

## 2017-12-28 LAB — COMPREHENSIVE METABOLIC PANEL
ALK PHOS: 206 U/L — AB (ref 38–126)
ALT: 16 U/L (ref 0–44)
AST: 16 U/L (ref 15–41)
Albumin: 3.1 g/dL — ABNORMAL LOW (ref 3.5–5.0)
Anion gap: 10 (ref 5–15)
BUN: 8 mg/dL (ref 6–20)
CALCIUM: 9.1 mg/dL (ref 8.9–10.3)
CHLORIDE: 104 mmol/L (ref 98–111)
CO2: 19 mmol/L — AB (ref 22–32)
CREATININE: 0.67 mg/dL (ref 0.44–1.00)
GFR calc Af Amer: >60 mL/min (ref >60)
Glucose, Bld: 98 mg/dL (ref 70–99)
Potassium: 4.1 mmol/L (ref 3.5–5.1)
Sodium: 133 mmol/L — ABNORMAL LOW (ref 135–145)
Total Bilirubin: 0.6 mg/dL (ref 0.3–1.2)
Total Protein: 6.5 g/dL (ref 6.5–8.1)

## 2017-12-28 LAB — CBC
HCT: 32.7 % — ABNORMAL LOW (ref 36.0–46.0)
Hemoglobin: 10.7 g/dL — ABNORMAL LOW (ref 12.0–15.0)
MCH: 26.2 pg (ref 26.0–34.0)
MCHC: 32.7 g/dL (ref 30.0–36.0)
MCV: 80 fL (ref 78.0–100.0)
PLATELETS: 280 10*3/uL (ref 150–400)
RBC: 4.09 MIL/uL (ref 3.87–5.11)
RDW: 15.9 % — ABNORMAL HIGH (ref 11.5–15.5)
WBC: 11.5 10*3/uL — ABNORMAL HIGH (ref 4.0–10.5)

## 2017-12-28 LAB — TYPE AND SCREEN
ABO/RH(D): AB POS
Antibody Screen: NEGATIVE

## 2017-12-28 LAB — PROTEIN / CREATININE RATIO, URINE
CREATININE, URINE: 155 mg/dL
Protein Creatinine Ratio: 0.26 mg/mg{Cre} — ABNORMAL HIGH (ref 0.00–0.15)
Total Protein, Urine: 41 mg/dL

## 2017-12-28 LAB — RPR: RPR Ser Ql: NONREACTIVE

## 2017-12-28 LAB — ABO/RH: ABO/RH(D): AB POS

## 2017-12-28 LAB — POCT FERN TEST: POCT FERN TEST: POSITIVE

## 2017-12-28 MED ORDER — LIDOCAINE HCL (PF) 1 % IJ SOLN
30.0000 mL | INTRAMUSCULAR | Status: DC | PRN
  Filled 2017-12-28: qty 30

## 2017-12-28 MED ORDER — SOD CITRATE-CITRIC ACID 500-334 MG/5ML PO SOLN: 30.0000 mL | ORAL | Status: DC | PRN

## 2017-12-28 MED ORDER — PHENYLEPHRINE 40 MCG/ML (10ML) SYRINGE FOR IV PUSH (FOR BLOOD PRESSURE SUPPORT)
80.0000 ug | PREFILLED_SYRINGE | INTRAVENOUS | Status: DC | PRN
  Filled 2017-12-28: qty 5

## 2017-12-28 MED ORDER — COCONUT OIL OIL: 1.0000 "application " | TOPICAL_OIL | Status: DC | PRN

## 2017-12-28 MED ORDER — OXYCODONE-ACETAMINOPHEN 5-325 MG PO TABS: 2.0000 | ORAL_TABLET | ORAL | Status: DC | PRN

## 2017-12-28 MED ORDER — ONDANSETRON HCL 4 MG/2ML IJ SOLN
4.0000 mg | Freq: Four times a day (QID) | INTRAMUSCULAR | Status: DC | PRN
  Administered 2017-12-28: 4 mg via INTRAVENOUS
  Filled 2017-12-28: qty 2

## 2017-12-28 MED ORDER — OXYTOCIN 40 UNITS IN LACTATED RINGERS INFUSION - SIMPLE MED
1.0000 m[IU]/min | INTRAVENOUS | Status: DC
  Administered 2017-12-28: 4 m[IU]/min via INTRAVENOUS
  Administered 2017-12-28: 2 m[IU]/min via INTRAVENOUS
  Filled 2017-12-28: qty 1000

## 2017-12-28 MED ORDER — OXYCODONE-ACETAMINOPHEN 5-325 MG PO TABS: 1.0000 | ORAL_TABLET | ORAL | Status: DC | PRN

## 2017-12-28 MED ORDER — LACTATED RINGERS IV SOLN: 500.0000 mL | INTRAVENOUS | Status: DC | PRN

## 2017-12-28 MED ORDER — FENTANYL CITRATE (PF) 100 MCG/2ML IJ SOLN
100.0000 ug | INTRAMUSCULAR | Status: DC | PRN
  Administered 2017-12-28: 100 ug via INTRAVENOUS
  Filled 2017-12-28: qty 2

## 2017-12-28 MED ORDER — TERBUTALINE SULFATE 1 MG/ML IJ SOLN: 0.2500 mg | Freq: Once | INTRAMUSCULAR | Status: DC | PRN

## 2017-12-28 MED ORDER — OXYTOCIN 40 UNITS IN LACTATED RINGERS INFUSION - SIMPLE MED: 1.0000 m[IU]/min | INTRAVENOUS | Status: DC

## 2017-12-28 MED ORDER — PHENYLEPHRINE 40 MCG/ML (10ML) SYRINGE FOR IV PUSH (FOR BLOOD PRESSURE SUPPORT)
80.0000 ug | PREFILLED_SYRINGE | INTRAVENOUS | Status: DC | PRN
  Filled 2017-12-28: qty 10
  Filled 2017-12-28: qty 5
  Filled 2017-12-28: qty 10

## 2017-12-28 MED ORDER — ACETAMINOPHEN 325 MG PO TABS: 650.0000 mg | ORAL_TABLET | ORAL | Status: DC | PRN

## 2017-12-28 MED ORDER — TETANUS-DIPHTH-ACELL PERTUSSIS 5-2.5-18.5 LF-MCG/0.5 IM SUSP: 0.5000 mL | Freq: Once | INTRAMUSCULAR | Status: DC

## 2017-12-28 MED ORDER — SIMETHICONE 80 MG PO CHEW: 80.0000 mg | CHEWABLE_TABLET | ORAL | Status: DC | PRN

## 2017-12-28 MED ORDER — EPHEDRINE 5 MG/ML INJ
10.0000 mg | INTRAVENOUS | Status: DC | PRN
  Filled 2017-12-28: qty 2

## 2017-12-28 MED ORDER — ONDANSETRON HCL 4 MG/2ML IJ SOLN: 4.0000 mg | INTRAMUSCULAR | Status: DC | PRN

## 2017-12-28 MED ORDER — FERROUS SULFATE 325 (65 FE) MG PO TABS
325.0000 mg | ORAL_TABLET | Freq: Two times a day (BID) | ORAL | Status: DC
  Administered 2017-12-28 – 2017-12-29 (×3): 325 mg via ORAL
  Filled 2017-12-28 (×3): qty 1

## 2017-12-28 MED ORDER — OXYTOCIN BOLUS FROM INFUSION
500.0000 mL | Freq: Once | INTRAVENOUS | Status: AC
  Administered 2017-12-28: 500 mL via INTRAVENOUS

## 2017-12-28 MED ORDER — OXYTOCIN 40 UNITS IN LACTATED RINGERS INFUSION - SIMPLE MED: 2.5000 [IU]/h | INTRAVENOUS | Status: DC

## 2017-12-28 MED ORDER — DIPHENHYDRAMINE HCL 25 MG PO CAPS: 25.0000 mg | ORAL_CAPSULE | Freq: Four times a day (QID) | ORAL | Status: DC | PRN

## 2017-12-28 MED ORDER — SODIUM BICARBONATE 8.4 % IV SOLN
INTRAVENOUS | Status: DC | PRN
  Administered 2017-12-28: 4 mL via EPIDURAL
  Administered 2017-12-28: 6 mL via EPIDURAL

## 2017-12-28 MED ORDER — MAGNESIUM HYDROXIDE 400 MG/5ML PO SUSP: 30.0000 mL | ORAL | Status: DC | PRN

## 2017-12-28 MED ORDER — ONDANSETRON HCL 4 MG PO TABS: 4.0000 mg | ORAL_TABLET | ORAL | Status: DC | PRN

## 2017-12-28 MED ORDER — LIDOCAINE HCL (PF) 1 % IJ SOLN
INTRAMUSCULAR | Status: DC | PRN
  Administered 2017-12-28: 6 mL via EPIDURAL

## 2017-12-28 MED ORDER — WITCH HAZEL-GLYCERIN EX PADS: 1.0000 "application " | MEDICATED_PAD | CUTANEOUS | Status: DC | PRN

## 2017-12-28 MED ORDER — ZOLPIDEM TARTRATE 5 MG PO TABS: 5.0000 mg | ORAL_TABLET | Freq: Every evening | ORAL | Status: DC | PRN

## 2017-12-28 MED ORDER — DIBUCAINE 1 % RE OINT: 1.0000 "application " | TOPICAL_OINTMENT | RECTAL | Status: DC | PRN

## 2017-12-28 MED ORDER — IBUPROFEN 600 MG PO TABS
600.0000 mg | ORAL_TABLET | Freq: Four times a day (QID) | ORAL | Status: DC
  Administered 2017-12-28 – 2017-12-29 (×6): 600 mg via ORAL
  Filled 2017-12-28 (×7): qty 1

## 2017-12-28 MED ORDER — FENTANYL 2.5 MCG/ML BUPIVACAINE 1/10 % EPIDURAL INFUSION (WH - ANES)
14.0000 mL/h | INTRAMUSCULAR | Status: DC | PRN
  Administered 2017-12-28: 14 mL/h via EPIDURAL
  Filled 2017-12-28 (×2): qty 100

## 2017-12-28 MED ORDER — MEASLES, MUMPS & RUBELLA VAC ~~LOC~~ INJ
0.5000 mL | INJECTION | Freq: Once | SUBCUTANEOUS | Status: DC
  Filled 2017-12-28: qty 0.5

## 2017-12-28 MED ORDER — PRENATAL MULTIVITAMIN CH
1.0000 | ORAL_TABLET | Freq: Every day | ORAL | Status: DC
  Administered 2017-12-29: 1 via ORAL
  Filled 2017-12-28: qty 1

## 2017-12-28 MED ORDER — LACTATED RINGERS IV SOLN: 500.0000 mL | Freq: Once | INTRAVENOUS | Status: DC

## 2017-12-28 MED ORDER — BENZOCAINE-MENTHOL 20-0.5 % EX AERO
1.0000 "application " | INHALATION_SPRAY | CUTANEOUS | Status: DC | PRN
  Administered 2017-12-28: 1 via TOPICAL
  Filled 2017-12-28: qty 56

## 2017-12-28 MED ORDER — TERBUTALINE SULFATE 1 MG/ML IJ SOLN
0.2500 mg | Freq: Once | INTRAMUSCULAR | Status: DC | PRN
  Filled 2017-12-28: qty 1

## 2017-12-28 MED ORDER — DIPHENHYDRAMINE HCL 50 MG/ML IJ SOLN: 12.5000 mg | INTRAMUSCULAR | Status: DC | PRN

## 2017-12-28 MED ORDER — LACTATED RINGERS IV SOLN: INTRAVENOUS | Status: DC

## 2017-12-28 NOTE — Anesthesia Procedure Notes (Signed)
Epidural Patient location during procedure: OB Start time: 12/28/2017 7:40 AM End time: 12/28/2017 7:50 AM  Staffing Anesthesiologist: Elmer Picker, MD Performed: anesthesiologist   Preanesthetic Checklist Completed: patient identified, pre-op evaluation, timeout performed, IV checked, risks and benefits discussed and monitors and equipment checked  Epidural Patient position: sitting Prep: site prepped and draped and DuraPrep Patient monitoring: continuous pulse ox, blood pressure, heart rate and cardiac monitor Approach: midline Location: L3-L4 Injection technique: LOR air  Needle:  Needle type: Tuohy  Needle gauge: 17 G Needle length: 9 cm Needle insertion depth: 6 cm Catheter type: closed end flexible Catheter size: 19 Gauge Catheter at skin depth: 11 cm Test dose: negative  Assessment Sensory level: T8 Events: blood not aspirated, injection not painful, no injection resistance, negative IV test and no paresthesia  Additional Notes Patient identified. Risks/Benefits/Options discussed with patient including but not limited to bleeding, infection, nerve damage, paralysis, failed block, incomplete pain control, headache, blood pressure changes, nausea, vomiting, reactions to medication both or allergic, itching and postpartum back pain. Confirmed with bedside nurse the patient's most recent platelet count. Confirmed with patient that they are not currently taking any anticoagulation, have any bleeding history or any family history of bleeding disorders. Patient expressed understanding and wished to proceed. All questions were answered. Sterile technique was used throughout the entire procedure. Please see nursing notes for vital signs. Test dose was given through epidural catheter and negative prior to continuing to dose epidural or start infusion. Warning signs of high block given to the patient including shortness of breath, tingling/numbness in hands, complete motor block, or  any concerning symptoms with instructions to call for help. Patient was given instructions on fall risk and not to get out of bed. All questions and concerns addressed with instructions to call with any issues or inadequate analgesia.  Reason for block:procedure for pain

## 2017-12-28 NOTE — Anesthesia Preprocedure Evaluation (Signed)
Anesthesia Evaluation  Patient identified by MRN, date of birth, ID band Patient awake    Reviewed: Allergy & Precautions, NPO status , Patient's Chart, lab work & pertinent test results  Airway Mallampati: II  TM Distance: >3 FB Neck ROM: Full    Dental no notable dental hx.    Pulmonary neg pulmonary ROS,    Pulmonary exam normal breath sounds clear to auscultation       Cardiovascular negative cardio ROS Normal cardiovascular exam Rhythm:Regular Rate:Normal     Neuro/Psych negative neurological ROS  negative psych ROS   GI/Hepatic negative GI ROS, Neg liver ROS,   Endo/Other  negative endocrine ROS  Renal/GU negative Renal ROS  negative genitourinary   Musculoskeletal negative musculoskeletal ROS (+)   Abdominal   Peds  Hematology negative hematology ROS (+)   Anesthesia Other Findings   Reproductive/Obstetrics                             Anesthesia Physical Anesthesia Plan  ASA: II  Anesthesia Plan: Epidural   Post-op Pain Management:    Induction:   PONV Risk Score and Plan: Treatment may vary due to age or medical condition  Airway Management Planned: Natural Airway  Additional Equipment:   Intra-op Plan:   Post-operative Plan:   Informed Consent: I have reviewed the patients History and Physical, chart, labs and discussed the procedure including the risks, benefits and alternatives for the proposed anesthesia with the patient or authorized representative who has indicated his/her understanding and acceptance.       Plan Discussed with: Anesthesiologist  Anesthesia Plan Comments: (Patient identified. Risks, benefits, options discussed with patient including but not limited to bleeding, infection, nerve damage, paralysis, failed block, incomplete pain control, headache, blood pressure changes, nausea, vomiting, reactions to medication, itching, and post partum back  pain. Confirmed with bedside nurse the patient's most recent platelet count. Confirmed with the patient that they are not taking any anticoagulation, have any bleeding history or any family history of bleeding disorders. Patient expressed understanding and wishes to proceed. All questions were answered. )        Anesthesia Quick Evaluation  

## 2017-12-28 NOTE — Anesthesia Postprocedure Evaluation (Signed)
Anesthesia Post Note  Patient: Nichole Taylor  Procedure(s) Performed: AN AD HOC LABOR EPIDURAL     Patient location during evaluation: Mother Baby Anesthesia Type: Epidural Level of consciousness: awake and alert, awake and oriented Pain management: pain level controlled Vital Signs Assessment: post-procedure vital signs reviewed and stable Respiratory status: spontaneous breathing, nonlabored ventilation and respiratory function stable Cardiovascular status: stable and blood pressure returned to baseline Postop Assessment: no headache, no backache, epidural receding, patient able to bend at knees, no apparent nausea or vomiting, adequate PO intake and able to ambulate Anesthetic complications: no    Last Vitals:  Vitals:   12/28/17 1255 12/28/17 1411  BP: 128/80 127/87  Pulse: 82 92  Resp: 18 18  Temp: 37.2 C 36.9 C  SpO2: 100% 99%    Last Pain:  Vitals:   12/28/17 1411  TempSrc: Oral  PainSc:    Pain Goal: Patients Stated Pain Goal: 2 (12/28/17 0546)               Blythe Stanford

## 2017-12-28 NOTE — Anesthesia Pain Management Evaluation Note (Signed)
  CRNA Pain Management Visit Note  Patient: Nichole Taylor, 22 y.o., female  "Hello I am a member of the anesthesia team at [facility]. We have an anesthesia team available at all times to provide care throughout the hospital, including epidural management and anesthesia for C-section. I don't know your plan for the delivery whether it a natural birth, water birth, IV sedation, nitrous supplementation, doula or epidural, but we want to meet your pain goals."   1.Was your pain managed to your expectations on prior hospitalizations?   No prior hospitalizations  2.What is your expectation for pain management during this hospitalization?     Epidural  3.How can we help you reach that goal? Epidural placed ~0748.  Record the patient's initial score and the patient's pain goal.   Pain: 10  Pain Goal: 1 [facility] wants you to be able to say your pain was always managed very well.  Lior Cartelli 12/28/2017

## 2017-12-28 NOTE — H&P (Signed)
Kimila Synnott is a 22 y.o. female presenting for Leaking of fluid since  Has been followed at [facility] since march (transferred from other practice).  Pregnancy has been uneventful.  Her chart lists a 2012 diagnosis of essential hypertension, but I do not see any notes associated with that diagnosis. Has not had any elevations during her visits.  Her diastolics run in the mid to high 80s.  . OB History    Gravida  1   Para      Term      Preterm      AB      Living        SAB      TAB      Ectopic      Multiple      Live Births             History reviewed. No pertinent past medical history. Past Surgical History:  Procedure Laterality Date  . KNEE SURGERY     Family History: family history includes Anemia in her mother; Diabetes in her father and paternal grandmother; Hypertension in her mother. Social History:  reports that she has never smoked. She has never used smokeless tobacco. She reports that she does not drink alcohol or use drugs.     Maternal Diabetes: No Genetic Screening: Normal Maternal Ultrasounds/Referrals: Normal Fetal Ultrasounds or other Referrals:  None Maternal Substance Abuse:  No Significant Maternal Medications:  None Significant Maternal Lab Results:  Lab values include: Group B Strep negative Other Comments:  None  Review of Systems  Constitutional: Negative for chills and fever.  Gastrointestinal: Positive for abdominal pain. Negative for constipation, diarrhea, nausea and vomiting.  Genitourinary: Negative for dysuria.  Musculoskeletal: Negative for back pain.  Neurological: Negative for headaches.   Maternal Medical History:  Reason for admission: Rupture of membranes.  Nausea.  Contractions: Onset was 1-2 hours ago.   Frequency: irregular.   Perceived severity is mild.    Fetal activity: Perceived fetal activity is normal.   Last perceived fetal movement was within the past hour.    Prenatal complications: No bleeding,  PIH, pre-eclampsia or preterm labor.   Prenatal Complications - Diabetes: none.      Blood pressure (!) 141/89, pulse 90, temperature 98.2 F (36.8 C), temperature source Oral, resp. rate 16, height 5\' 5"  (1.651 m), weight 93.4 kg, last menstrual period 03/22/2017. Maternal Exam:  Uterine Assessment: Contraction strength is mild.  Contraction frequency is irregular.   Abdomen: Patient reports no abdominal tenderness. Fetal presentation: vertex  Introitus: Normal vulva. Normal vagina.  Ferning test: positive.  Nitrazine test: not done. Amniotic fluid character: clear.  Pelvis: adequate for delivery.   Cervix: Cervix evaluated by digital exam.     Fetal Exam Fetal Monitor Review: Mode: ultrasound.   Baseline rate: 140.  Variability: moderate (6-25 bpm).   Pattern: accelerations present and no decelerations.    Fetal State Assessment: Category I - tracings are normal.     Physical Exam  Constitutional: She is oriented to person, place, and time. She appears well-developed and well-nourished. No distress.  HENT:  Head: Normocephalic.  Cardiovascular: Normal rate and regular rhythm.  Respiratory: Effort normal and breath sounds normal. No respiratory distress.  GI: Soft. She exhibits no distension. There is no tenderness. There is no rebound and no guarding.  Genitourinary:  Genitourinary Comments: Dilation: 2 Effacement (%): 90 Cervical Position: Middle Station: -1 Presentation: Vertex Exam by:: Wynelle Bourgeois, CNM   Musculoskeletal: Normal range  of motion.  Neurological: She is alert and oriented to person, place, and time.  Skin: Skin is warm and dry.  Psychiatric: She has a normal mood and affect.    Prenatal labs: ABO, Rh:   Antibody:   Rubella:   RPR: Non Reactive (06/03 0151)  HBsAg:    HIV: Non Reactive (06/03 0151)  GBS: Negative (08/12 1202)   Assessment/Plan: Single Intrauterine Pregnancy at [redacted]w[redacted]d PROM at term Latent labor  Admit to [facility] Routine orders Discussed expectant management vs Pitocin augmentation, prefers augmentation  Wynelle Bourgeois 12/28/2017, 2:36 AM

## 2017-12-29 ENCOUNTER — Encounter: Payer: Managed Care, Other (non HMO) | Admitting: Family Medicine

## 2017-12-29 NOTE — Progress Notes (Signed)
Pt Bp 138/93. Dr Earlene Plater aware. New orders received

## 2017-12-29 NOTE — Progress Notes (Addendum)
Post Partum Day 1  Subjective:  Nichole Taylor is a 22 y.o. G1P1001 [redacted]w[redacted]d s/p SVD.  No acute events overnight.  Pt denies problems with ambulating, voiding or po intake.  She denies nausea or vomiting.  Pain is well controlled.  She has had flatus. She has not had bowel movement.  Lochia Small.  Plan for birth control is Nexplanon .  Method of Feeding: Breast and bottle feeding   Denies HA, blurry vision, NVD, problems with urination.  Objective: BP (!) 129/92   Pulse 86   Temp 97.7 F (36.5 C) (Oral)   Resp 19   Ht 5\' 5"  (1.651 m)   Wt 93.4 kg   LMP 03/22/2017   SpO2 99%   Breastfeeding? Unknown   BMI 34.27 kg/m   Physical Exam:  General: alert, cooperative and no distress Lochia:normal flow Chest: CTAB, no wheezes, rhonchi  Heart: RRR no m/r/g Abdomen: +BS, soft, nontender, fundus firm at/below umbilicus Uterine Fundus: firm, non-tender  DVT Evaluation: No evidence of DVT seen on physical exam. Extremities: no noted edema  Recent Labs    12/28/17 0310  HGB 10.7*  HCT 32.7*    Assessment/Plan:  ASSESSMENT: Nichole Taylor is a 22 y.o. G1P1001 [redacted]w[redacted]d ppd #1 s/p NSVD doing well. Pregnancy complicated by possible gHTN during labor.   Possible gHTN  - mild range elevated BP during labor.  - 1 mildly elevated BP post delivery at 129/92  - aysmptomatic  - continue to monitor vitals   Plan for discharge tomorrow, Breastfeeding and Lactation consult   LOS: 1 day   Celedonio Savage 12/29/2017, 9:01 AM   OB FELLOW MEDICAL STUDENT NOTE ATTESTATION  I confirm that I have verified the information documented in the medical student's note and that I have also personally performed the physical exam and all medical decision making activities.   Nichole Taylor is 22 y.o. G1P1001 female PPD #1 from SVD with history of gHTN. Patient reports feeling well. Ambulating without difficulty, voiding normally, lochia is mild, pain is well controlled. Vital signs stable, one minimally  elevated diastolic BP noted. Will continue to monitor. Patient asymptomatic. Physical exam benign with firm uterine fundus and no LE edema. Patient is breast and bottle feeding. Plans for Nexplanon for contraception. Plan for d/c tomorrow.   Marcy Siren, D.O. OB Fellow  12/29/2017, 11:43 AM

## 2017-12-29 NOTE — Lactation Note (Signed)
This note was copied from a baby's chart. Lactation Consultation Note  Patient Name: Nichole Taylor [id number] Date: 12/29/2017 Reason for consult: Initial assessment;Primapara;1st time breastfeeding;Term  Visited with P1 Mom of term baby at 27 hrs.  Mom states baby latches well on right breast, but "doesn't like" her left one.  Talked about offering baby a different position.  Baby woke up and started crying.  Offered to assist and assess latch. Baby latched with some guidance on left breast using cross cradle hold.  Mom able to hand express easy flow of colostrum. Baby relaxed and sucked/swallowed rhythmically and Mom denied any discomfort.   Baby has deep dimpling when sucking, but investigated wide gape of mouth, flanged lips, and when baby taken off the breast, nipple rounded not pinched.  Mom latched baby on again, needing guidance with waiting for a wider gape of mouth.  Encouraged STS and cue based feedings.  8-12 feedings per 24 hrs recommended.  Lactation brochure left and Mom aware of IP and OP lactation services available to her.  Maternal Data Formula Feeding for Exclusion: No Has patient been taught Hand Expression?: Yes Does the patient have breastfeeding experience prior to this delivery?: No  Feeding Feeding Type: Breast Fed Length of feed: 15 min  LATCH Score Latch: Grasps breast easily, tongue down, lips flanged, rhythmical sucking.  Audible Swallowing: Spontaneous and intermittent  Type of Nipple: Everted at rest and after stimulation  Comfort (Breast/Nipple): Soft / non-tender  Hold (Positioning): Assistance needed to correctly position infant at breast and maintain latch.  LATCH Score: 9  Interventions Interventions: Breast feeding basics reviewed;Assisted with latch;Skin to skin;Breast massage;Hand express;Breast compression;Adjust position;Support pillows;Position options;Expressed milk  Lactation Tools Discussed/Used WIC Program: Yes   Consult  Status Consult Status: Follow-up Date: 12/30/17 Follow-up type: In-patient    [name] 12/29/2017, 2:45 PM

## 2017-12-29 NOTE — Progress Notes (Signed)
MOB was referred for history of depression/anxiety. * Referral screened out by Clinical Social Worker because none of the following criteria appear to apply: ~ History of anxiety/depression during this pregnancy, or of post-partum depression following prior delivery. No concerns noted in OB records,  ~ Diagnosis of anxiety and/or depression within last 3 years OR * MOB's symptoms currently being treated with medication and/or therapy.  Please contact the Clinical Social Worker if needs arise, by MOB request, or if MOB scores greater than 9/yes to question 10 on Edinburgh Postpartum Depression Screen.  Holger Sokolowski Boyd-Gilyard, MSW, LCSW Clinical Social Work [phone] 

## 2017-12-30 MED ORDER — FERROUS SULFATE 325 (65 FE) MG PO TABS: 325.0000 mg | ORAL_TABLET | Freq: Two times a day (BID) | ORAL | 0 refills | Status: DC

## 2017-12-30 MED ORDER — IBUPROFEN 600 MG PO TABS: 600.0000 mg | ORAL_TABLET | Freq: Three times a day (TID) | ORAL | 0 refills | Status: AC | PRN

## 2017-12-30 NOTE — Plan of Care (Signed)
Progressing appropriately. Encouraged to call for assistance as needed, and for LATCH assessment.  

## 2017-12-30 NOTE — Progress Notes (Signed)
Patient had old records scanned in .Marland KitchenMarland Kitchenpatietn is immune

## 2018-01-01 NOTE — Discharge Summary (Signed)
Obstetrics Discharge Summary OB/GYN [facility]   Patient Name: Nichole Taylor DOB: 1995/07/07 MRN: [id number]  Date of admission: 12/28/2017 Delivering MD: Dorathy Kinsman   Date of discharge: 01/01/2018  Admitting diagnosis: 40 WEEKS ROM Intrauterine pregnancy: [redacted]w[redacted]d     Secondary diagnosis:   Active Problems:   Post term pregnancy at [redacted] weeks gestation   SVD (spontaneous vaginal delivery)  Additional problems:  . History of Depression . Chronic Hypertension (though no records available, patient report)     Discharge diagnosis: Term Pregnancy Delivered                                            Postpartum procedures: None  Complications: none  Hospital course: Nichole Taylor is a 22 y.o. [redacted]w[redacted]d who was admitted for PROM. Her pregnancy was complicated by the above problems. Routine labor course. Delivery was complicated by hemostatic 1st degree perineal lacerations. Please see delivery/op note for additional details. Her postpartum course was uncomplicated aside from one elevated BP but asymptomatic. She was breastfeeding without difficulty but was meeting with lactation consultant. By day of discharge, she was passing flatus, urinating, eating and drinking without difficulty. Her pain was well-controlled, and she was discharged home with plans to follow-up in clinic in about 4 weeks as well as a blood pressure check within the next week.   Physical exam  Vitals:   12/29/17 0517 12/29/17 1511 12/29/17 1745 12/30/17 0900  BP: (!) 129/92 (!) 138/93 114/79 128/90  Pulse: 86 83  80  Resp:  17  18  Temp: 97.7 F (36.5 C) 98 F (36.7 C)    TempSrc: Oral Oral    SpO2:  100%  99%  Weight:      Height:       General: well-appearing, sitting up in bed, NAD Lochia: appropriate Uterine Fundus: firm, below level of umbilicus Incision: N/A DVT Evaluation: No evidence of DVT seen on physical exam. No significant calf/ankle edema. Labs: Lab Results  Component Value Date   WBC  11.5 (H) 12/28/2017   HGB 10.7 (L) 12/28/2017   HCT 32.7 (L) 12/28/2017   MCV 80.0 12/28/2017   PLT 280 12/28/2017   CMP Latest Ref Rng & Units 12/28/2017  Glucose 70 - 99 mg/dL 98  BUN 6 - 20 mg/dL 8  Creatinine 2.40 - 9.73 mg/dL 5.32  Sodium 992 - 426 mmol/L 133(L)  Potassium 3.5 - 5.1 mmol/L 4.1  Chloride 98 - 111 mmol/L 104  CO2 22 - 32 mmol/L 19(L)  Calcium 8.9 - 10.3 mg/dL 9.1  Total Protein 6.5 - 8.1 g/dL 6.5  Total Bilirubin 0.3 - 1.2 mg/dL 0.6  Alkaline Phos 38 - 126 U/L 206(H)  AST 15 - 41 U/L 16  ALT 0 - 44 U/L 16    Discharge instructions: Per After Visit Summary and "Baby and Me Booklet"  After visit meds:  Allergies as of 12/30/2017   No Known Allergies     Medication List    STOP taking these medications   COMFORT FIT MATERNITY SUPP MED Misc   terconazole 0.4 % vaginal cream Commonly known as:  TERAZOL 7   VITAFOL ULTRA 29-0.6-0.4-200 MG Caps     TAKE these medications   ferrous sulfate 325 (65 FE) MG tablet Take 1 tablet (325 mg total) by mouth 2 (two) times daily with a meal.   ibuprofen 600 MG tablet  Commonly known as:  ADVIL,MOTRIN Take 1 tablet (600 mg total) by mouth every 8 (eight) hours as needed.      Postpartum contraception: either Nexplanon or Depo Diet: Routine Diet Activity: Advance as tolerated. Pelvic rest for 6 weeks.   Outpatient follow up:4 weeks, 1 week for BP check Follow-up Appt: Future Appointments  Date Time Provider Department Center  01/26/2018  9:30 AM Brock Bad, MD [facility] None   Newborn Data: Live born female  Birth Weight: 8 lb 6.9 oz (3825 g) APGAR: 9, 9  Newborn Delivery   Birth date/time:  12/28/2017 10:57:00 Delivery type:  Vaginal, Spontaneous    Baby Feeding: Breast Disposition:home with mother  Cristal Deer. Earlene Plater, DO OB/GYN Fellow, Faculty Practice

## 2018-01-02 NOTE — Discharge Summary (Deleted)
Obstetrics Discharge Summary OB/GYN Faculty [facility]   Patient Name: Nichole Taylor DOB: 07-Aug-1995 MRN: [id number]  Date of admission: 12/28/2017 Delivering MD: Dorathy Kinsman   Date of discharge: 01/02/2018  Admitting diagnosis: 40 WEEKS ROM Intrauterine pregnancy: [redacted]w[redacted]d     Secondary diagnosis:   Active Problems:   Post term pregnancy at [redacted] weeks gestation   SVD (spontaneous vaginal delivery)   Additional problems:  . none     Discharge diagnosis: Term Pregnancy Delivered                                            Postpartum procedures: None  Complications: none  Hospital course: Nichole Taylor is a 22 y.o. [redacted]w[redacted]d who was admitted for SROM. Her pregnancy was uncomlicated. Her labor course was unremarkable. Delivery was uncomlicated. Please see delivery/op note for additional details. Her postpartum course was uncomplicated. She was breastfeeding without difficulty. By day of discharge, she was passing flatus, urinating, eating and drinking without difficulty. Her pain was well-controlled, and she was discharged home with ibuprofen. She will follow-up in clinic in 4 wks weeks.   Physical exam  Vitals:   12/29/17 0517 12/29/17 1511 12/29/17 1745 12/30/17 0900  BP: (!) 129/92 (!) 138/93 114/79 128/90  Pulse: 86 83  80  Resp:  17  18  Temp: 97.7 F (36.5 C) 98 F (36.7 C)    TempSrc: Oral Oral    SpO2:  100%  99%  Weight:      Height:       General: AAO x3, NAD Lochia: appropriate Uterine Fundus: firm Incision: N/A DVT Evaluation: No evidence of DVT seen on physical exam. Negative Homan's sign. No cords or calf tenderness. Labs: Lab Results  Component Value Date   WBC 11.5 (H) 12/28/2017   HGB 10.7 (L) 12/28/2017   HCT 32.7 (L) 12/28/2017   MCV 80.0 12/28/2017   PLT 280 12/28/2017   CMP Latest Ref Rng & Units 12/28/2017  Glucose 70 - 99 mg/dL 98  BUN 6 - 20 mg/dL 8  Creatinine 6.57 - 8.46 mg/dL 9.62  Sodium 952 - 841 mmol/L 133(L)  Potassium 3.5 - 5.1 mmol/L  4.1  Chloride 98 - 111 mmol/L 104  CO2 22 - 32 mmol/L 19(L)  Calcium 8.9 - 10.3 mg/dL 9.1  Total Protein 6.5 - 8.1 g/dL 6.5  Total Bilirubin 0.3 - 1.2 mg/dL 0.6  Alkaline Phos 38 - 126 U/L 206(H)  AST 15 - 41 U/L 16  ALT 0 - 44 U/L 16    Discharge instructions: Per After Visit Summary and "Baby and Me Booklet"  After visit meds:  Allergies as of 12/30/2017   No Known Allergies     Medication List    STOP taking these medications   COMFORT FIT MATERNITY SUPP MED Misc   terconazole 0.4 % vaginal cream Commonly known as:  TERAZOL 7   VITAFOL ULTRA 29-0.6-0.4-200 MG Caps     TAKE these medications   ferrous sulfate 325 (65 FE) MG tablet Take 1 tablet (325 mg total) by mouth 2 (two) times daily with a meal.   ibuprofen 600 MG tablet Commonly known as:  ADVIL,MOTRIN Take 1 tablet (600 mg total) by mouth every 8 (eight) hours as needed.       Postpartum contraception:Nexplanon vs Depo outpt Diet: Routine Diet Activity: Advance as tolerated. Pelvic rest for 6 weeks.  Outpatient follow up:4 wks Follow-up Appt: Future Appointments  Date Time Provider Department Center  01/26/2018  9:30 AM Brock Bad, MD [facility] None   Follow-up Visit:No follow-ups on file.  Newborn Data: Live born female  Birth Weight: 8 lb 6.9 oz (3825 g) APGAR: 9, 9  Newborn Delivery   Birth date/time:  12/28/2017 10:57:00 Delivery type:  Vaginal, Spontaneous     Baby Feeding: Breast Disposition:home with mother

## 2018-01-21 ENCOUNTER — Other Ambulatory Visit: Payer: Self-pay | Admitting: Family Medicine

## 2018-01-26 ENCOUNTER — Ambulatory Visit (INDEPENDENT_AMBULATORY_CARE_PROVIDER_SITE_OTHER): Payer: Managed Care, Other (non HMO) | Admitting: Obstetrics

## 2018-01-26 ENCOUNTER — Encounter: Payer: Self-pay | Admitting: Obstetrics

## 2018-01-26 DIAGNOSIS — Z1389 Encounter for screening for other disorder: Secondary | ICD-10-CM

## 2018-01-26 DIAGNOSIS — Z30013 Encounter for initial prescription of injectable contraceptive: Secondary | ICD-10-CM

## 2018-01-26 DIAGNOSIS — Z3009 Encounter for other general counseling and advice on contraception: Secondary | ICD-10-CM

## 2018-01-26 MED ORDER — MEDROXYPROGESTERONE ACETATE 150 MG/ML IM SUSP: 150.0000 mg | INTRAMUSCULAR | 4 refills | Status: DC

## 2018-01-26 MED ORDER — VITAFOL ULTRA 29-0.6-0.4-200 MG PO CAPS: 1.0000 | ORAL_CAPSULE | Freq: Every day | ORAL | 12 refills | Status: AC

## 2018-01-26 NOTE — Progress Notes (Signed)
Post Partum Exam  Nichole Taylor is a 22 y.o. G42P1001 female who presents for a postpartum visit. She is 4 weeks postpartum following a spontaneous vaginal delivery. I have fully reviewed the prenatal and intrapartum course. The delivery was at 40 gestational weeks.  Anesthesia: epidural. Postpartum course has been well. Baby's course has been well. Baby is feeding by breast. Bleeding no bleeding. Bowel function is abnormal: constapation goes twice a week . Bladder function is normal. Patient is not sexually active. Contraception method is none. Postpartum depression screening:neg  The following portions of the patient's history were reviewed and updated as appropriate: allergies, current medications, past family history, past medical history, past social history, past surgical history and problem list. Last pap smear done 06/03/2017 and was Normal  Review of Systems A comprehensive review of systems was negative.    Objective:  unknown if currently breastfeeding.  General:  alert and no distress   Breasts:  inspection negative, no nipple discharge or bleeding, no masses or nodularity palpable  Lungs: clear to auscultation bilaterally  Heart:  regular rate and rhythm, S1, S2 normal, no murmur, click, rub or gallop  Abdomen: soft, non-tender; bowel sounds normal; no masses,  no organomegaly   Assessment:   1. Postpartum care following vaginal delivery Rx: - Prenat-Fe Poly-Methfol-FA-DHA (VITAFOL ULTRA) 29-0.6-0.4-200 MG CAPS; Take 1 tablet by mouth daily.  Dispense: 30 capsule; Refill: 12  2. Encounter for other general counseling and advice on contraception - wants to start Depo Provera Injections  3. Encounter for initial prescription of injectable contraceptive Rx: - medroxyPROGESTERone (DEPO-PROVERA) 150 MG/ML injection; Inject 1 mL (150 mg total) into the muscle every 3 (three) months.  Dispense: 1 mL; Refill: 4  Plan:   1. Contraception: Depo-Provera injections 2. Continue  PNV's 3. Follow up in: 6 weeks or as needed.    Brock Bad MD 01-25-2018

## 2018-02-16 ENCOUNTER — Ambulatory Visit (INDEPENDENT_AMBULATORY_CARE_PROVIDER_SITE_OTHER): Payer: Managed Care, Other (non HMO) | Admitting: Obstetrics

## 2018-02-16 ENCOUNTER — Encounter: Payer: Self-pay | Admitting: *Deleted

## 2018-02-16 ENCOUNTER — Ambulatory Visit: Payer: Managed Care, Other (non HMO) | Admitting: Obstetrics

## 2018-02-16 ENCOUNTER — Encounter: Payer: Self-pay | Admitting: Obstetrics

## 2018-02-16 VITALS — BP 132/87 | HR 90 | Resp 18 | Ht 65.5 in | Wt 177.3 lb

## 2018-02-16 DIAGNOSIS — Z01818 Encounter for other preprocedural examination: Secondary | ICD-10-CM | POA: Diagnosis not present

## 2018-02-16 DIAGNOSIS — Z30017 Encounter for initial prescription of implantable subdermal contraceptive: Secondary | ICD-10-CM

## 2018-02-16 LAB — POCT URINE PREGNANCY: Preg Test, Ur: NEGATIVE

## 2018-02-16 MED ORDER — ETONOGESTREL 68 MG ~~LOC~~ IMPL
68.0000 mg | DRUG_IMPLANT | Freq: Once | SUBCUTANEOUS | Status: AC
  Administered 2018-02-16: 68 mg via SUBCUTANEOUS

## 2018-02-16 NOTE — Progress Notes (Signed)
Nexplanon Procedure Note   PRE-OP DIAGNOSIS: desired long-term, reversible contraception  POST-OP DIAGNOSIS: Same  PROCEDURE: Nexplanon  placement Performing Provider:  Brock Bad MD  Patient education prior to procedure, explained risk, benefits of Nexplanon, reviewed alternative options. Patient reported understanding. Gave consent to continue with procedure.   PROCEDURE:  Pregnancy Text :  Negative Site (check):      left arm         Sterile Preparation:   Betadinex3 Lot # [id number] Expiration Date:  [id number]  Insertion site was selected 8 - 10 cm from medial epicondyle and marked along with guiding site using sterile marker. Procedure area was prepped and draped in a sterile fashion. 1% Lidocaine 1.5 ml given prior to procedure. Nexplanon  was inserted subcutaneously.Needle was removed from the insertion site. Nexplanon capsule was palpated by provider and patient to assure satisfactory placement. Dressing applied.  Followup: The patient tolerated the procedure well without complications.  Standard post-procedure care is explained and return precautions are given.  Brock Bad MD 02-16-2018

## 2018-03-02 ENCOUNTER — Encounter: Payer: Self-pay | Admitting: Obstetrics

## 2018-03-02 ENCOUNTER — Ambulatory Visit (INDEPENDENT_AMBULATORY_CARE_PROVIDER_SITE_OTHER): Payer: Managed Care, Other (non HMO) | Admitting: Obstetrics

## 2018-03-02 VITALS — BP 126/85 | HR 85 | Resp 16 | Ht 65.5 in | Wt 175.6 lb

## 2018-03-02 DIAGNOSIS — Z3046 Encounter for surveillance of implantable subdermal contraceptive: Secondary | ICD-10-CM

## 2018-03-02 NOTE — Progress Notes (Signed)
Subjective:    Nichole Taylor is a 22 y.o. female who presents for follow up after Nexplanon Insertion. The patient has no complaints today. The patient is sexually active. Pertinent past medical history: none.  The information documented in the HPI was reviewed and verified.  Menstrual History: OB History    Gravida  1   Para  1   Term  1   Preterm      AB      Living  1     SAB      TAB      Ectopic      Multiple  0   Live Births  1            Patient's last menstrual period was 03/02/2018 (exact date).   Patient Active Problem List   Diagnosis Date Noted  . Post term pregnancy at [redacted] weeks gestation 12/28/2017  . SVD (spontaneous vaginal delivery) 12/28/2017  . Supervision of normal first pregnancy, antepartum 07/06/2017  . History of depression 07/06/2017  . Essential (primary) hypertension 01/16/2011   History reviewed. No pertinent past medical history.  Past Surgical History:  Procedure Laterality Date  . KNEE SURGERY       Current Outpatient Medications:  .  ferrous sulfate 325 (65 FE) MG tablet, TAKE 1 TABLET (325 MG TOTAL) BY MOUTH 2 (TWO) TIMES DAILY WITH A MEAL., Disp: 60 tablet, Rfl: 0 .  ibuprofen (ADVIL,MOTRIN) 600 MG tablet, Take 1 tablet (600 mg total) by mouth every 8 (eight) hours as needed., Disp: 30 tablet, Rfl: 0 .  Prenat-Fe Poly-Methfol-FA-DHA (VITAFOL ULTRA) 29-0.6-0.4-200 MG CAPS, Take 1 tablet by mouth daily., Disp: 30 capsule, Rfl: 12 No Known Allergies  Social History   Tobacco Use  . Smoking status: Never Smoker  . Smokeless tobacco: Never Used  Substance Use Topics  . Alcohol use: No    Frequency: Never    Family History  Problem Relation Age of Onset  . Hypertension Mother   . Anemia Mother   . Diabetes Father   . Diabetes Paternal Grandmother        Review of Systems Constitutional: negative for weight loss Genitourinary:negative for abnormal menstrual periods and vaginal discharge   Objective:   BP  126/85 (BP Location: Right Arm, Patient Position: Sitting, Cuff Size: Normal)   Pulse 85   Resp 16   Ht 5' 5.5" (1.664 m)   Wt 175 lb 9.6 oz (79.7 kg)   LMP 03/02/2018 (Exact Date)   Breastfeeding? Yes   BMI 28.78 kg/m   PE:          General:  Alert and no distress          Left Upper Extremity:  Nexplanon insertion site is clean, dry and intact.  Rod palpated, intact and non tender.          Lungs: Clear          Heart:  RRR   Lab Review Urine pregnancy test Labs reviewed yes Radiologic studies reviewed no  50% of 15 min visit spent on counseling and coordination of care.    Assessment:    22 y.o., continuing Nexplanon, no contraindications.   Plan:    All questions answered. Contraception: Nexplanon. Discussed healthy lifestyle modifications. Follow up in 4 months. No orders of the defined types were placed in this encounter.  No orders of the defined types were placed in this encounter.   Brock Bad MD 03-02-2018

## 2018-04-02 ENCOUNTER — Other Ambulatory Visit: Payer: Self-pay | Admitting: Family Medicine

## 2018-04-08 MED ORDER — FERROUS SULFATE 325 (65 FE) MG PO TABS: 325.0000 mg | ORAL_TABLET | Freq: Two times a day (BID) | ORAL | 1 refills | Status: AC

## 2018-04-08 NOTE — Addendum Note (Signed)
Addended by: Tamera StandsWALLACE, Anyah Swallow S on: 04/08/2018 04:45 PM   Modules accepted: Orders

## 2018-04-15 ENCOUNTER — Ambulatory Visit: Payer: Managed Care, Other (non HMO)

## 2018-09-13 ENCOUNTER — Other Ambulatory Visit: Payer: Self-pay | Admitting: *Deleted

## 2018-09-13 DIAGNOSIS — B379 Candidiasis, unspecified: Secondary | ICD-10-CM

## 2018-09-13 MED ORDER — FLUCONAZOLE 150 MG PO TABS: 150.0000 mg | ORAL_TABLET | Freq: Once | ORAL | 0 refills | Status: AC

## 2018-09-13 NOTE — Progress Notes (Signed)
Pt called to office asking for yeast tx. Pt ask for Rx to be sent to another pharmacy since she is visiting her mom. Rx for Diflucan sent today per protocol.

## 2018-10-26 ENCOUNTER — Other Ambulatory Visit: Payer: Self-pay

## 2018-10-26 DIAGNOSIS — N898 Other specified noninflammatory disorders of vagina: Secondary | ICD-10-CM

## 2018-10-26 DIAGNOSIS — N760 Acute vaginitis: Secondary | ICD-10-CM

## 2018-10-26 MED ORDER — FLUCONAZOLE 150 MG PO TABS: 150.0000 mg | ORAL_TABLET | Freq: Once | ORAL | 0 refills | Status: AC

## 2018-10-26 MED ORDER — METRONIDAZOLE 500 MG PO TABS: 500.0000 mg | ORAL_TABLET | Freq: Two times a day (BID) | ORAL | 0 refills | Status: DC

## 2018-10-26 NOTE — Progress Notes (Signed)
Rx sent per protocol for BV sx's.  

## 2019-07-12 IMAGING — US US MFM OB FOLLOW-UP
1 series · 14 of 28 positions shown · non-contrast
Comparison: none

[Series 1: us mfm ob follow-up · 53 acquisitions, 14 frames shown]
[im 2/53]
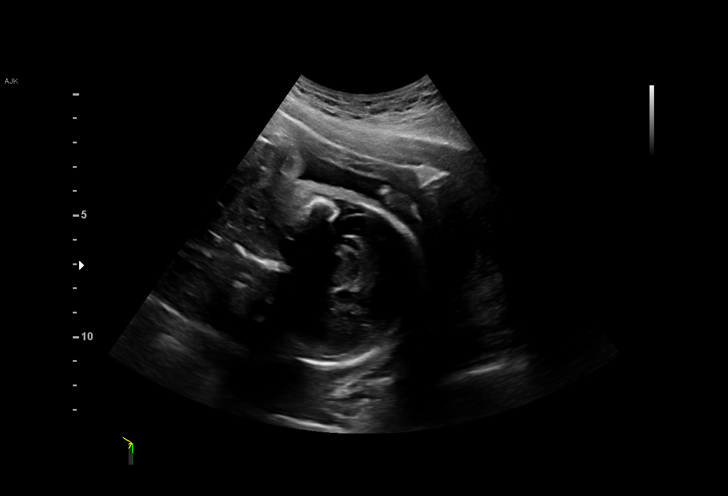
[im 6/53]
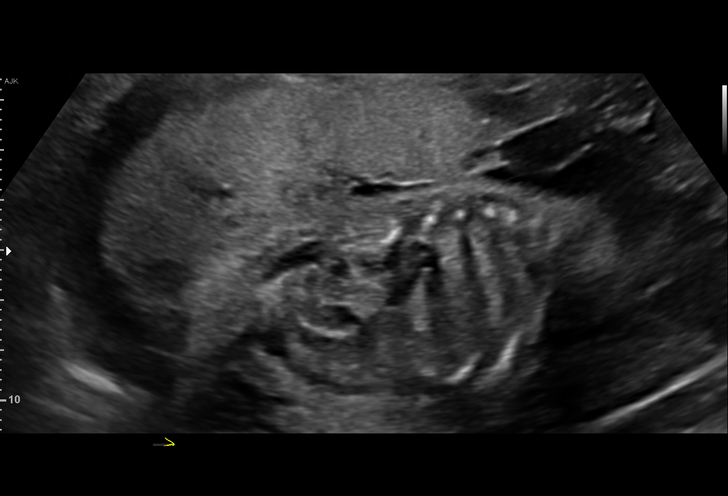
[im 10/53]
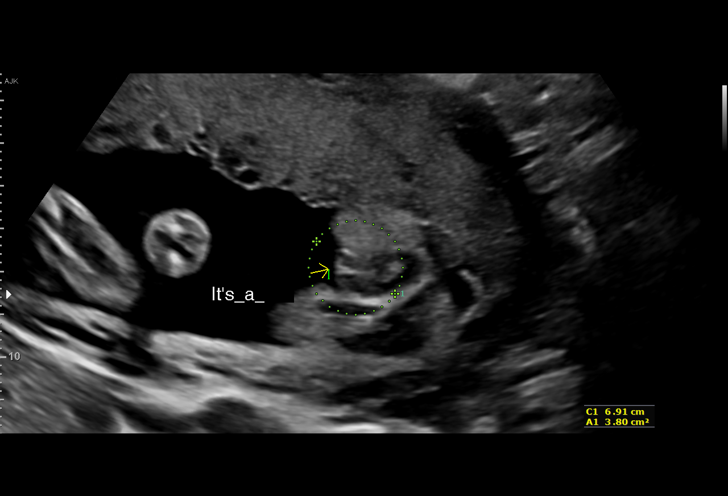
[im 14/53]
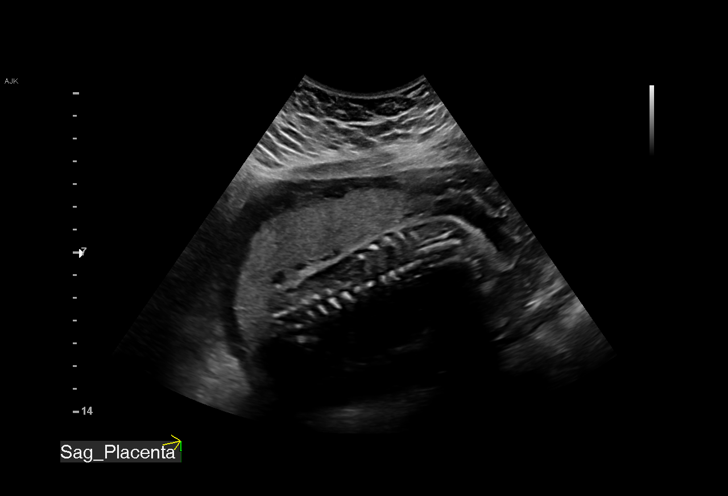
[im 18/53]
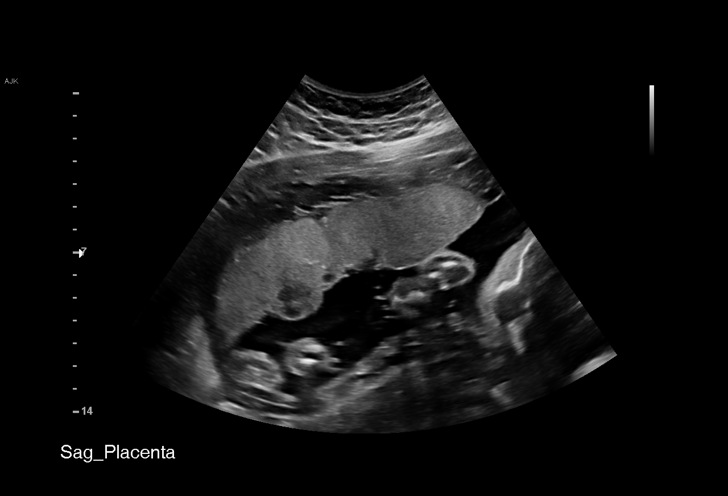
[im 22/53]
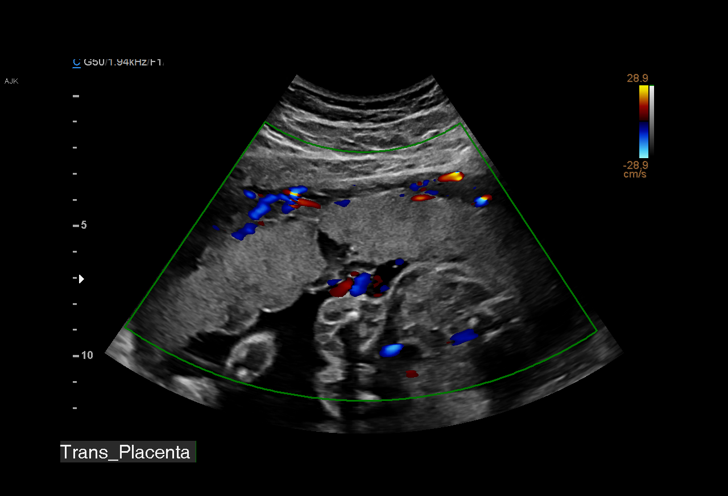
[im 26/53]
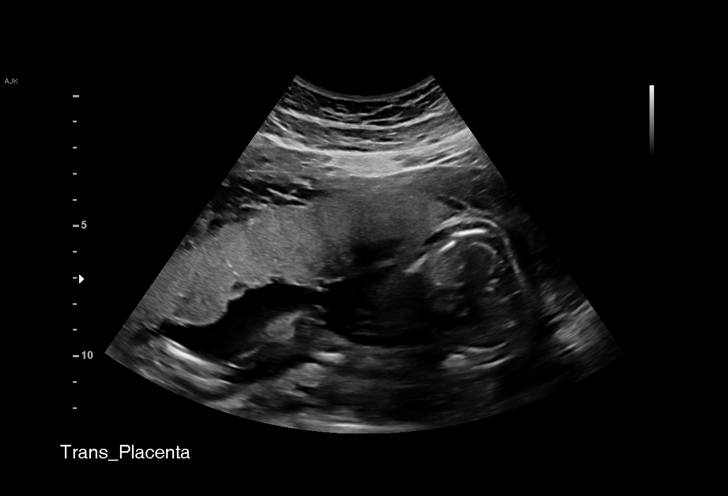
[im 29/53]
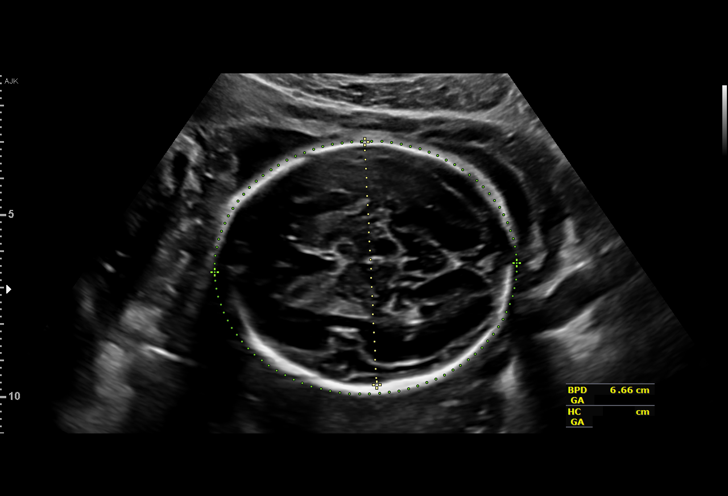
[im 33/53]
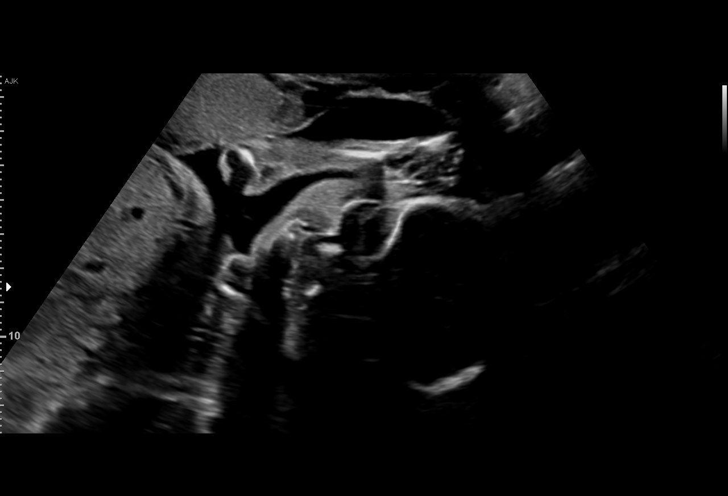
[im 37/53]
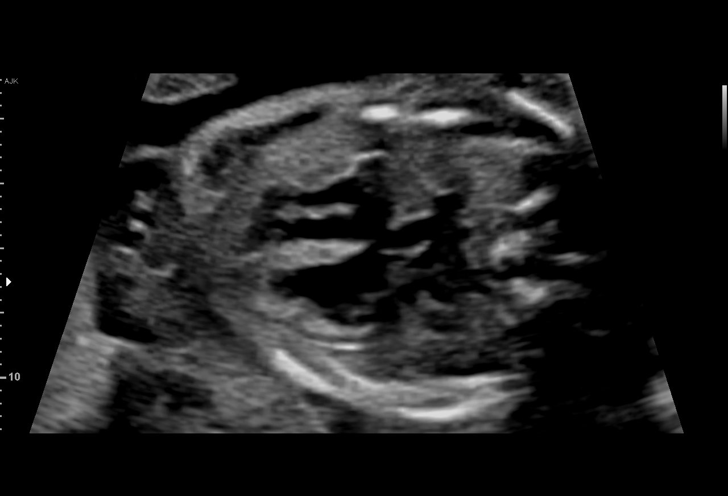
[im 41/53]
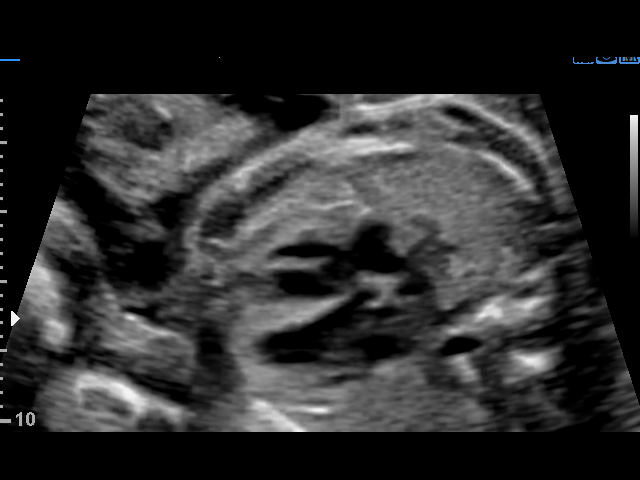
[im 45/53]
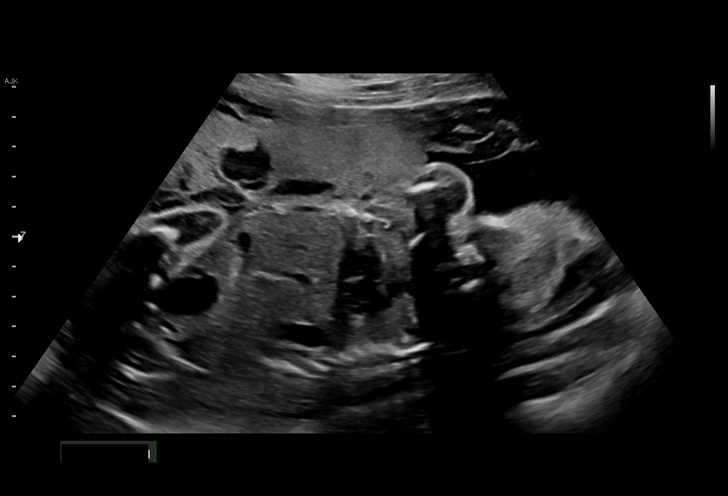
[im 49/53]
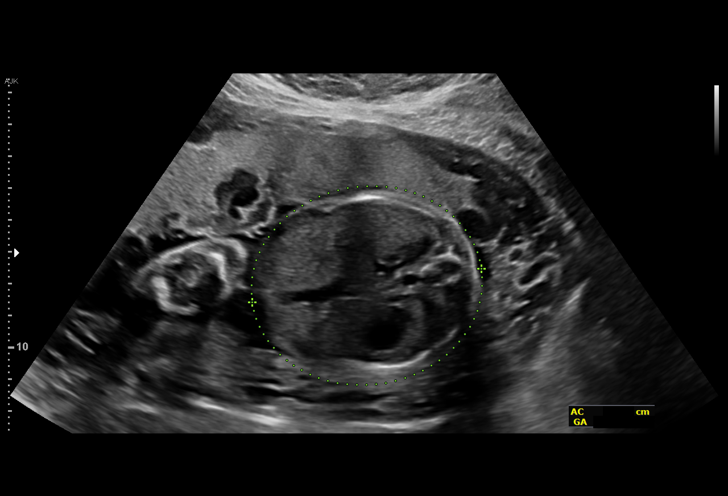
[im 53/53]
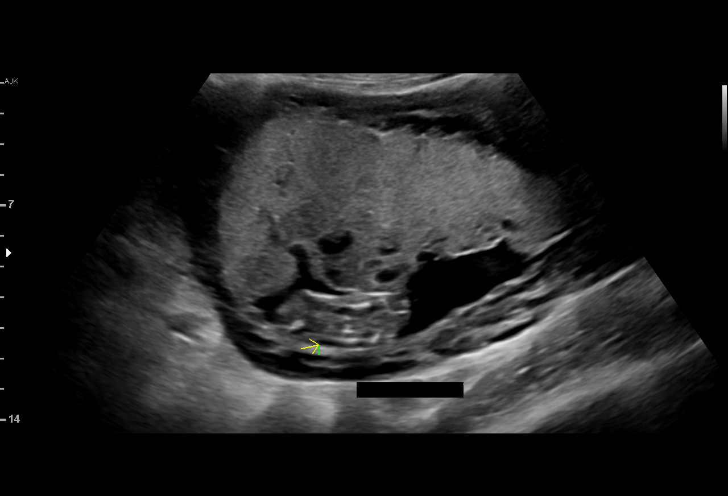

[14 of 28 positions shown; findings below may reference images not displayed]

Indications

25 weeks gestation of pregnancy
Encounter for other antenatal screening
follow-up
OB History

Gravidity:    1
Fetal Evaluation

Num Of Fetuses:     1
Fetal Heart         149
Rate(bpm):
Cardiac Activity:   Observed
Presentation:       Cephalic
Placenta:           Anterior, above cervical os
P. Cord Insertion:  Visualized

Amniotic Fluid
AFI FV:      Subjectively low-normal

Largest Pocket(cm)
4.57
Biometry

BPD:      66.6  mm     G. Age:  26w 6d         91  %    CI:        79.19   %    70 - 86
FL/HC:      19.4   %    18.7 -
HC:      236.6  mm     G. Age:  25w 5d         49  %    HC/AC:      1.12        1.04 -
AC:      211.9  mm     G. Age:  25w 5d         59  %    FL/BPD:     68.8   %    71 - 87
FL:       45.8  mm     G. Age:  25w 2d         38  %    FL/AC:      21.6   %    20 - 24

Est. FW:     828  gm    1 lb 13 oz      62  %
Gestational Age

LMP:           25w 1d        Date:  03/22/17                 EDD:   12/27/17
U/S Today:     25w 6d                                        EDD:   12/22/17
Best:          25w 1d     Det. By:  LMP  (03/22/17)          EDD:   12/27/17
Anatomy

Cranium:               Appears normal         Aortic Arch:            Previously seen
Cavum:                 Appears normal         Ductal Arch:            Previously seen
Ventricles:            Appears normal         Diaphragm:              Appears normal
Choroid Plexus:        Previously seen        Stomach:                Appears normal, left
sided
Cerebellum:            Previously seen        Abdomen:                Appears normal
Posterior Fossa:       Previously seen        Abdominal Wall:         Previously seen
Nuchal Fold:           Previously seen        Cord Vessels:           Previously seen
Face:                  Orbits and profile     Kidneys:                Appear normal
previously seen
Lips:                  Previously seen        Bladder:                Appears normal
Thoracic:              Appears normal         Spine:                  Previously seen
Heart:                 Appears normal         Upper Extremities:      Previously seen
(4CH, axis, and situs
RVOT:                  Appears normal         Lower Extremities:      Previously seen
LVOT:                  Appears normal

Other:  Fetus appears to be a female.
Cervix Uterus Adnexa

Cervix
Length:           3.24  cm.
Normal appearance by transabdominal scan.

Uterus
No abnormality visualized.

Left Ovary
Not visualized.

Right Ovary
Not visualized.

Adnexa:       No abnormality visualized.
Impression

Single living intrauterine pregnancy at 25w 1d.
Cephalic presentation.
Placenta Anterior, above cervical os.
Normal amniotic fluid volume.
Appropriate interval fetal growth.
Normal interval fetal anatomy.
Recommendations

Follow-up ultrasounds as clinically indicated.

## 2019-08-29 ENCOUNTER — Other Ambulatory Visit: Payer: Self-pay

## 2019-08-29 DIAGNOSIS — N760 Acute vaginitis: Secondary | ICD-10-CM

## 2019-08-29 DIAGNOSIS — N898 Other specified noninflammatory disorders of vagina: Secondary | ICD-10-CM

## 2019-08-29 MED ORDER — METRONIDAZOLE 500 MG PO TABS: 500.0000 mg | ORAL_TABLET | Freq: Two times a day (BID) | ORAL | 0 refills | Status: AC

## 2019-08-29 MED ORDER — FLUCONAZOLE 150 MG PO TABS: 150.0000 mg | ORAL_TABLET | Freq: Once | ORAL | 0 refills | Status: AC

## 2019-08-29 NOTE — Progress Notes (Signed)
Rx sent per protocol
